# Patient Record
Sex: Male | Born: 1960 | Race: White | Hispanic: No | Smoking: Former smoker
Health system: Southern US, Community
[De-identification: ages and names within clinical notes are randomized; demographics above are authoritative.]

## PROBLEM LIST (undated history)

## (undated) DIAGNOSIS — E785 Hyperlipidemia, unspecified: Secondary | ICD-10-CM

## (undated) DIAGNOSIS — N200 Calculus of kidney: Secondary | ICD-10-CM

## (undated) DIAGNOSIS — I456 Pre-excitation syndrome: Secondary | ICD-10-CM

## (undated) DIAGNOSIS — M549 Dorsalgia, unspecified: Secondary | ICD-10-CM

## (undated) HISTORY — DX: Pre-excitation syndrome: I45.6

## (undated) HISTORY — DX: Dorsalgia, unspecified: M54.9

## (undated) HISTORY — DX: Calculus of kidney: N20.0

## (undated) HISTORY — DX: Hyperlipidemia, unspecified: E78.5

---

## 1983-05-24 HISTORY — PX: KNEE SURGERY: SHX244

## 1997-09-25 ENCOUNTER — Ambulatory Visit (HOSPITAL_BASED_OUTPATIENT_CLINIC_OR_DEPARTMENT_OTHER): Admission: RE | Admit: 1997-09-25 | Discharge: 1997-09-25 | Payer: Self-pay | Admitting: *Deleted

## 2002-11-25 ENCOUNTER — Emergency Department (HOSPITAL_COMMUNITY): Admission: EM | Admit: 2002-11-25 | Discharge: 2002-11-25 | Payer: Self-pay | Admitting: Emergency Medicine

## 2002-11-25 ENCOUNTER — Encounter: Payer: Self-pay | Admitting: Emergency Medicine

## 2004-04-09 ENCOUNTER — Ambulatory Visit: Payer: Self-pay | Admitting: Internal Medicine

## 2004-07-15 ENCOUNTER — Emergency Department (HOSPITAL_COMMUNITY): Admission: EM | Admit: 2004-07-15 | Discharge: 2004-07-15 | Payer: Self-pay | Admitting: Emergency Medicine

## 2004-12-08 ENCOUNTER — Ambulatory Visit: Payer: Self-pay | Admitting: Family Medicine

## 2005-04-20 ENCOUNTER — Emergency Department (HOSPITAL_COMMUNITY): Admission: EM | Admit: 2005-04-20 | Discharge: 2005-04-20 | Payer: Self-pay | Admitting: *Deleted

## 2005-04-23 ENCOUNTER — Emergency Department (HOSPITAL_COMMUNITY): Admission: EM | Admit: 2005-04-23 | Discharge: 2005-04-24 | Payer: Self-pay | Admitting: Emergency Medicine

## 2008-12-11 ENCOUNTER — Encounter: Admission: RE | Admit: 2008-12-11 | Discharge: 2008-12-11 | Payer: Self-pay | Admitting: Emergency Medicine

## 2009-01-01 ENCOUNTER — Emergency Department (HOSPITAL_COMMUNITY): Admission: EM | Admit: 2009-01-01 | Discharge: 2009-01-02 | Payer: Self-pay | Admitting: Emergency Medicine

## 2009-01-23 ENCOUNTER — Emergency Department (HOSPITAL_COMMUNITY)
Admission: EM | Admit: 2009-01-23 | Discharge: 2009-01-23 | Payer: Self-pay | Admitting: Blood Banking & Transfusion Medicine

## 2009-02-06 ENCOUNTER — Ambulatory Visit (HOSPITAL_COMMUNITY): Admission: RE | Admit: 2009-02-06 | Discharge: 2009-02-06 | Payer: Self-pay | Admitting: Orthopaedic Surgery
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
## 2010-06-14 ENCOUNTER — Encounter: Payer: Self-pay | Admitting: Emergency Medicine

## 2010-06-14 ENCOUNTER — Encounter: Payer: Self-pay | Admitting: Orthopaedic Surgery

## 2012-11-13 ENCOUNTER — Encounter (HOSPITAL_COMMUNITY): Payer: Self-pay | Admitting: Emergency Medicine

## 2012-11-13 DIAGNOSIS — N200 Calculus of kidney: Secondary | ICD-10-CM | POA: Insufficient documentation

## 2012-11-13 DIAGNOSIS — R39198 Other difficulties with micturition: Secondary | ICD-10-CM | POA: Insufficient documentation

## 2012-11-13 DIAGNOSIS — R112 Nausea with vomiting, unspecified: Secondary | ICD-10-CM | POA: Insufficient documentation

## 2012-11-13 NOTE — ED Notes (Signed)
PT. REPORTS RIGHT LOWER ABDOMINAL PAIN RADIATING TO RIGHT LOWER BACK WITH EMESIS ONSET Thursday  LAST WEEK , DENIES DIARRHEA  , NO FEVER , SLIGHT CHILLS.

## 2012-11-14 ENCOUNTER — Emergency Department (HOSPITAL_COMMUNITY): Payer: Self-pay

## 2012-11-14 ENCOUNTER — Encounter (HOSPITAL_COMMUNITY): Payer: Self-pay | Admitting: Radiology

## 2012-11-14 ENCOUNTER — Emergency Department (HOSPITAL_COMMUNITY)
Admission: EM | Admit: 2012-11-14 | Discharge: 2012-11-14 | Disposition: A | Discharge status: Home / Self Care | Payer: Self-pay | Attending: Emergency Medicine | Admitting: Emergency Medicine

## 2012-11-14 DIAGNOSIS — N200 Calculus of kidney: Secondary | ICD-10-CM

## 2012-11-14 LAB — CBC WITH DIFFERENTIAL/PLATELET
Basophils Absolute: 0.1 10*3/uL (ref 0.0–0.1)
Basophils Relative: 1 % (ref 0–1)
Eosinophils Absolute: 0.2 10*3/uL (ref 0.0–0.7)
Eosinophils Relative: 2 % (ref 0–5)
HCT: 39.5 % (ref 39.0–52.0)
Hemoglobin: 13.9 g/dL (ref 13.0–17.0)
Lymphocytes Relative: 14 % (ref 12–46)
Lymphs Abs: 1.4 10*3/uL (ref 0.7–4.0)
MCH: 29.3 pg (ref 26.0–34.0)
MCHC: 35.2 g/dL (ref 30.0–36.0)
MCV: 83.2 fL (ref 78.0–100.0)
Monocytes Absolute: 0.9 10*3/uL (ref 0.1–1.0)
Monocytes Relative: 8 % (ref 3–12)
Neutro Abs: 8 10*3/uL — ABNORMAL HIGH (ref 1.7–7.7)
Neutrophils Relative %: 76 % (ref 43–77)
Platelets: 216 10*3/uL (ref 150–400)
RBC: 4.75 MIL/uL (ref 4.22–5.81)
RDW: 12.7 % (ref 11.5–15.5)
WBC: 10.5 10*3/uL (ref 4.0–10.5)

## 2012-11-14 LAB — URINALYSIS, ROUTINE W REFLEX MICROSCOPIC
Bilirubin Urine: NEGATIVE
Glucose, UA: NEGATIVE mg/dL
Ketones, ur: NEGATIVE mg/dL
Leukocytes, UA: NEGATIVE
Nitrite: NEGATIVE
Protein, ur: NEGATIVE mg/dL
Specific Gravity, Urine: 1.033 — ABNORMAL HIGH (ref 1.005–1.030)
Urobilinogen, UA: 0.2 mg/dL (ref 0.0–1.0)
pH: 5.5 (ref 5.0–8.0)

## 2012-11-14 LAB — URINE MICROSCOPIC-ADD ON

## 2012-11-14 LAB — COMPREHENSIVE METABOLIC PANEL
ALT: 17 U/L (ref 0–53)
AST: 20 U/L (ref 0–37)
Albumin: 4 g/dL (ref 3.5–5.2)
Alkaline Phosphatase: 76 U/L (ref 39–117)
BUN: 25 mg/dL — ABNORMAL HIGH (ref 6–23)
CO2: 28 mEq/L (ref 19–32)
Calcium: 9.1 mg/dL (ref 8.4–10.5)
Chloride: 105 mEq/L (ref 96–112)
Creatinine, Ser: 1.09 mg/dL (ref 0.50–1.35)
GFR calc Af Amer: 88 mL/min — ABNORMAL LOW (ref >90)
GFR calc non Af Amer: 76 mL/min — ABNORMAL LOW (ref >90)
Glucose, Bld: 115 mg/dL — ABNORMAL HIGH (ref 70–99)
Potassium: 3.9 mEq/L (ref 3.5–5.1)
Sodium: 140 mEq/L (ref 135–145)
Total Bilirubin: 0.4 mg/dL (ref 0.3–1.2)
Total Protein: 6.8 g/dL (ref 6.0–8.3)

## 2012-11-14 LAB — LIPASE, BLOOD: Lipase: 29 U/L (ref 11–59)

## 2012-11-14 MED ORDER — ONDANSETRON HCL 4 MG/2ML IJ SOLN
4.0000 mg | Freq: Once | INTRAMUSCULAR | Status: AC
  Administered 2012-11-14: 4 mg via INTRAVENOUS
  Filled 2012-11-14: qty 2

## 2012-11-14 MED ORDER — OXYCODONE-ACETAMINOPHEN 5-325 MG PO TABS: 1.0000 | ORAL_TABLET | ORAL | Status: DC | PRN

## 2012-11-14 MED ORDER — TAMSULOSIN HCL 0.4 MG PO CAPS: 0.4000 mg | ORAL_CAPSULE | Freq: Every day | ORAL | Status: DC

## 2012-11-14 MED ORDER — PROMETHAZINE HCL 25 MG PO TABS: 25.0000 mg | ORAL_TABLET | Freq: Four times a day (QID) | ORAL | Status: DC | PRN

## 2012-11-14 MED ORDER — KETOROLAC TROMETHAMINE 30 MG/ML IJ SOLN
30.0000 mg | Freq: Once | INTRAMUSCULAR | Status: AC
  Administered 2012-11-14: 30 mg via INTRAVENOUS
  Filled 2012-11-14: qty 1

## 2012-11-14 MED ORDER — SODIUM CHLORIDE 0.9 % IV BOLUS (SEPSIS)
1000.0000 mL | Freq: Once | INTRAVENOUS | Status: AC
  Administered 2012-11-14: 1000 mL via INTRAVENOUS

## 2012-11-14 NOTE — ED Provider Notes (Signed)
History    CSN: [id number] Arrival date & time 11/13/12  2323  First MD Initiated Contact with Patient 11/14/12 0202     Chief Complaint  Patient presents with  . Abdominal Pain   (Consider location/radiation/quality/duration/timing/severity/associated sxs/prior Treatment) HPI  History provided by pt.   Pt has had gradually worsening, waxing and waning pain in R flank and right side of abd for the past 3 days. Sx improve w/ pepto bismol.  Associated w/ N/V, weakened urine stream and darkly colored urine.  Denies fever and change in bowels.  Denies trauma.   Does a lot of heavy lifting at work but his pain is not aggravated by lifting now.  Remote h/o L ureteral stone and current pain feels similar, though he not certain it is the same.     History reviewed. No pertinent past medical history. Past Surgical History  Procedure Laterality Date  . Knee surgery     No family history on file. History  Substance Use Topics  . Smoking status: Never Smoker   . Smokeless tobacco: Not on file  . Alcohol Use: Yes    Review of Systems  All other systems reviewed and are negative.    Allergies  Review of patient's allergies indicates no known allergies.  Home Medications   Current Outpatient Rx  Name  Route  Sig  Dispense  Refill  . bismuth subsalicylate (PEPTO BISMOL) 262 MG/15ML suspension   Oral   Take 30 mLs by mouth every 6 (six) hours as needed for indigestion.         Marland Kitchen ibuprofen (ADVIL,MOTRIN) 200 MG tablet   Oral   Take 400 mg by mouth every 6 (six) hours as needed for pain.          BP 157/86  Temp(Src) 99.4 F (37.4 C) (Oral)  Resp 18  SpO2 97% Physical Exam  Nursing note and vitals reviewed. Constitutional: He is oriented to person, place, and time. He appears well-developed and well-nourished.  Pt does not appear uncomfortable  HENT:  Head: Normocephalic and atraumatic.  Eyes:  Normal appearance  Neck: Normal range of motion.  Cardiovascular: Normal  rate and regular rhythm.   Pulmonary/Chest: Effort normal and breath sounds normal. No respiratory distress.  Abdominal: Soft. Bowel sounds are normal. He exhibits no distension and no mass. There is no tenderness. There is no rebound and no guarding.  Mild epigastric, RUQ and periumbilical ttp  Genitourinary:  R CVA tenderness  Musculoskeletal: Normal range of motion.  No tenderness of lumbar spine  Neurological: He is alert and oriented to person, place, and time.  Skin: Skin is warm and dry. No rash noted.  Psychiatric: He has a normal mood and affect. His behavior is normal.    ED Course  Procedures (including critical care time) Labs Reviewed  CBC WITH DIFFERENTIAL - Abnormal; Notable for the following:    Neutro Abs 8.0 (*)    All other components within normal limits  COMPREHENSIVE METABOLIC PANEL - Abnormal; Notable for the following:    Glucose, Bld 115 (*)    BUN 25 (*)    GFR calc non Af Amer 76 (*)    GFR calc Af Amer 88 (*)    All other components within normal limits  URINALYSIS, ROUTINE W REFLEX MICROSCOPIC - Abnormal; Notable for the following:    Specific Gravity, Urine 1.033 (*)    Hgb urine dipstick LARGE (*)    All other components within normal limits  LIPASE,  BLOOD  URINE MICROSCOPIC-ADD ON   Ct Abdomen Pelvis Wo Contrast  11/14/2012   *RADIOLOGY REPORT*  Clinical Data: Right lower abdominal pain.  Vomiting.  CT ABDOMEN AND PELVIS WITHOUT CONTRAST  Technique:  Multidetector CT imaging of the abdomen and pelvis was performed following the standard protocol without intravenous contrast.  Comparison: 05/14/2007  Findings: Lung bases are clear.  No effusions.  Heart is normal size.  Liver, spleen, gallbladder, pancreas, adrenals and left kidney are unremarkable on this unenhanced study.  Mild right hydronephrosis due to a 3 mm distal right ureteral stone.  Urinary bladder is unremarkable as is the prostate.  Appendix is visualized and is normal.  Scattered  descending colonic and sigmoid diverticulosis. No active diverticulitis.  Small bowel is decompressed.  Aorta is normal caliber.  No acute bony abnormality.  IMPRESSION: 3 mm distal right ureteral stone with mild right hydronephrosis.  Descending colonic and sigmoid diverticulosis.   Original Report Authenticated By: Charlett Nose, M.D.   1. Nephrolithiasis     MDM  52yo M w/ remote h/o L ureteral stone presents w/ waxing and waning R flank pain x 3d.  No trauma but does a lot of heavy lifting at work.  On exam, afebrile, NAD, abd soft/non-distended, tenderness epigastrium, RUQ and periumbilical region and R CVA.  Labs sig for hemoglobinuria.  CT abd/pelvis ordered to confirm R-sided nephrolithiasis because there are characteristics of pain that are not consistent w/ a stone.  Pt to receive IVF and toradol.  2:42 AM   CT shows 3mm R ureteral stone.  Results discussed w/ pt.  Pt had relief of pain w/ toradol.  He is tolerating pos.  D/c'd home w/ percocet, promethazine and flomax as well as referral to urology.  Return precautions discussed.  3:59 AM   Otilio Miu, PA-C 11/14/12 [phone]

## 2012-11-14 NOTE — ED Notes (Signed)
Patient presents c/o right flank pain that does radiate around to the front but most of the pain is in his back.  Stated the pain started last Thursday and got worse over the past few days.  Stated he has been noticing that when he urinates the stream is not as strong.

## 2012-11-15 NOTE — ED Provider Notes (Signed)
Medical screening examination/treatment/procedure(s) were performed by non-physician practitioner and as supervising physician I was immediately available for consultation/collaboration.   Toua Stites, MD 11/15/12 0929 

## 2014-01-07 ENCOUNTER — Encounter (HOSPITAL_COMMUNITY): Payer: Self-pay | Admitting: Emergency Medicine

## 2014-01-07 DIAGNOSIS — R1011 Right upper quadrant pain: Secondary | ICD-10-CM | POA: Insufficient documentation

## 2014-01-07 DIAGNOSIS — R109 Unspecified abdominal pain: Secondary | ICD-10-CM | POA: Insufficient documentation

## 2014-01-07 LAB — CBC WITH DIFFERENTIAL/PLATELET
Basophils Absolute: 0.1 10*3/uL (ref 0.0–0.1)
Basophils Relative: 1 % (ref 0–1)
Eosinophils Absolute: 0.3 10*3/uL (ref 0.0–0.7)
Eosinophils Relative: 3 % (ref 0–5)
HCT: 46.2 % (ref 39.0–52.0)
Hemoglobin: 16 g/dL (ref 13.0–17.0)
Lymphocytes Relative: 27 % (ref 12–46)
Lymphs Abs: 2.4 10*3/uL (ref 0.7–4.0)
MCH: 29.7 pg (ref 26.0–34.0)
MCHC: 34.6 g/dL (ref 30.0–36.0)
MCV: 85.9 fL (ref 78.0–100.0)
Monocytes Absolute: 0.6 10*3/uL (ref 0.1–1.0)
Monocytes Relative: 7 % (ref 3–12)
Neutro Abs: 5.5 10*3/uL (ref 1.7–7.7)
Neutrophils Relative %: 62 % (ref 43–77)
Platelets: 239 10*3/uL (ref 150–400)
RBC: 5.38 MIL/uL (ref 4.22–5.81)
RDW: 12.9 % (ref 11.5–15.5)
WBC: 8.8 10*3/uL (ref 4.0–10.5)

## 2014-01-07 LAB — URINALYSIS, ROUTINE W REFLEX MICROSCOPIC
Bilirubin Urine: NEGATIVE
Glucose, UA: NEGATIVE mg/dL
Hgb urine dipstick: NEGATIVE
Ketones, ur: 15 mg/dL — AB
Leukocytes, UA: NEGATIVE
Nitrite: NEGATIVE
Protein, ur: NEGATIVE mg/dL
Specific Gravity, Urine: 1.029 (ref 1.005–1.030)
Urobilinogen, UA: 0.2 mg/dL (ref 0.0–1.0)
pH: 5 (ref 5.0–8.0)

## 2014-01-07 LAB — COMPREHENSIVE METABOLIC PANEL
ALT: 28 U/L (ref 0–53)
AST: 28 U/L (ref 0–37)
Albumin: 4.1 g/dL (ref 3.5–5.2)
Alkaline Phosphatase: 81 U/L (ref 39–117)
Anion gap: 10 (ref 5–15)
BUN: 17 mg/dL (ref 6–23)
CO2: 29 mEq/L (ref 19–32)
Calcium: 9.8 mg/dL (ref 8.4–10.5)
Chloride: 100 mEq/L (ref 96–112)
Creatinine, Ser: 0.85 mg/dL (ref 0.50–1.35)
GFR calc Af Amer: >90 mL/min (ref >90)
GFR calc non Af Amer: >90 mL/min (ref >90)
Glucose, Bld: 81 mg/dL (ref 70–99)
Potassium: 4.8 mEq/L (ref 3.7–5.3)
Sodium: 139 mEq/L (ref 137–147)
Total Bilirubin: 0.3 mg/dL (ref 0.3–1.2)
Total Protein: 7.2 g/dL (ref 6.0–8.3)

## 2014-01-07 LAB — LIPASE, BLOOD: Lipase: 42 U/L (ref 11–59)

## 2014-01-07 NOTE — ED Notes (Signed)
The patient said he has been having abdominal pain for two months and it has gotten worse instead of better.  He says he feels like he is constipated but is  Having regular bowel movements.  He also said he feels like there is a "growth" in his stomach that is getting bigger and his abdomen is getting swollen.  He denies N/V, diarrhea or any other symptoms other than pain.

## 2014-01-07 NOTE — ED Notes (Signed)
Charting performed by this RN charted in error: DISREGARD.

## 2014-01-08 ENCOUNTER — Emergency Department (HOSPITAL_COMMUNITY)
Admission: EM | Admit: 2014-01-08 | Discharge: 2014-01-08 | Disposition: A | Discharge status: Home / Self Care | Payer: Managed Care, Other (non HMO) | Attending: Emergency Medicine | Admitting: Emergency Medicine

## 2014-01-08 ENCOUNTER — Encounter (HOSPITAL_COMMUNITY): Payer: Self-pay

## 2014-01-08 ENCOUNTER — Emergency Department (HOSPITAL_COMMUNITY): Payer: Managed Care, Other (non HMO)

## 2014-01-08 DIAGNOSIS — R1011 Right upper quadrant pain: Secondary | ICD-10-CM

## 2014-01-08 LAB — I-STAT CG4 LACTIC ACID, ED: Lactic Acid, Venous: 1.11 mmol/L (ref 0.5–2.2)

## 2014-01-08 MED ORDER — KETOROLAC TROMETHAMINE 30 MG/ML IJ SOLN
30.0000 mg | Freq: Once | INTRAMUSCULAR | Status: AC
  Administered 2014-01-08: 30 mg via INTRAVENOUS
  Filled 2014-01-08: qty 1

## 2014-01-08 MED ORDER — IOHEXOL 300 MG/ML  SOLN
25.0000 mL | Freq: Once | INTRAMUSCULAR | Status: AC | PRN
  Administered 2014-01-08: 25 mL via ORAL

## 2014-01-08 MED ORDER — IOHEXOL 300 MG/ML  SOLN
100.0000 mL | Freq: Once | INTRAMUSCULAR | Status: AC | PRN
  Administered 2014-01-08: 100 mL via INTRAVENOUS

## 2014-01-08 MED ORDER — HYDROCODONE-ACETAMINOPHEN 5-325 MG PO TABS: 1.0000 | ORAL_TABLET | Freq: Four times a day (QID) | ORAL | Status: DC | PRN

## 2014-01-08 MED ORDER — ONDANSETRON HCL 4 MG/2ML IJ SOLN
4.0000 mg | INTRAMUSCULAR | Status: AC
  Administered 2014-01-08: 4 mg via INTRAVENOUS
  Filled 2014-01-08: qty 2

## 2014-01-08 NOTE — ED Provider Notes (Signed)
CSN: [id number]     Arrival date & time 01/07/14  2042 History   First MD Initiated Contact with Patient 01/08/14 0030     Chief Complaint  Patient presents with  . Abdominal Pain    The patient has been having abdominal pain for the last two months.  He says he thought the pain would go away but it has gotten worse.    (Consider location/radiation/quality/duration/timing/severity/associated sxs/prior Treatment) HPI Comments: Patient is a 53 year old male who presents to the emergency department for further evaluation of abdominal pain. Patient states that her abdominal pain began as an aching discomfort in his right upper abdomen 2 months ago. He states that it has been constant and gradually worsening since this time. He states he has developed an intermittent sharp pain sensation recently. Patient also feels as though his abdominal pain has been spreading to include more of his abdomen. Patient has been taking Tylenol for symptoms without significant improvement. He endorses some associated subjective swelling to his right abdomen. He denies fever, chest pain, shortness of breath, nausea, vomiting, diarrhea, melena or hematochezia, urinary symptoms, numbness/tingling, and extremity weakness. Patient denies history of abdominal surgeries.  The history is provided by the patient. No language interpreter was used.    History reviewed. No pertinent past medical history. Past Surgical History  Procedure Laterality Date  . Knee surgery     History reviewed. No pertinent family history. History  Substance Use Topics  . Smoking status: Never Smoker   . Smokeless tobacco: Not on file  . Alcohol Use: Yes    Review of Systems  Gastrointestinal: Positive for abdominal pain.  All other systems reviewed and are negative.   Allergies  Review of patient's allergies indicates no known allergies.  Home Medications   Prior to Admission medications   Medication Sig Start Date End Date Taking?  Authorizing Provider  ibuprofen (ADVIL,MOTRIN) 200 MG tablet Take 400 mg by mouth every 6 (six) hours as needed for pain.   Yes Historical Provider, MD  HYDROcodone-acetaminophen (NORCO/VICODIN) 5-325 MG per tablet Take 1-2 tablets by mouth every 6 (six) hours as needed for moderate pain or severe pain. 01/08/14   Antony MaduraKelly Lula Kolton, PA-C   BP 99/69  Pulse 58  Temp(Src) 97.9 F (36.6 C) (Oral)  Resp 16  Wt 167 lb (75.751 kg)  SpO2 95%  Physical Exam  Nursing note and vitals reviewed. Constitutional: He is oriented to person, place, and time. He appears well-developed and well-nourished. No distress.  Nontoxic/nonseptic appearing  HENT:  Head: Normocephalic and atraumatic.  Eyes: Conjunctivae and EOM are normal. No scleral icterus.  Neck: Normal range of motion.  Cardiovascular: Normal rate, regular rhythm and normal heart sounds.   Pulmonary/Chest: Effort normal and breath sounds normal. No respiratory distress. He has no wheezes. He has no rales.  Chest expansion symmetric  Abdominal: Soft. He exhibits no distension. There is tenderness. There is no rebound and no guarding.  Tenderness to palpation diffusely to abdomen. No focal tenderness. No Murphy's sign. No focal tenderness at McBurney's point. No peritoneal signs or involuntary guarding. No rebound tenderness.  Musculoskeletal: Normal range of motion.  Neurological: He is alert and oriented to person, place, and time. He exhibits normal muscle tone. Coordination normal.  Skin: Skin is warm and dry. No rash noted. He is not diaphoretic. No erythema. No pallor.  Psychiatric: He has a normal mood and affect. His behavior is normal.    ED Course  Procedures (including critical care time) Labs  Review Labs Reviewed  URINALYSIS, ROUTINE W REFLEX MICROSCOPIC - Abnormal; Notable for the following:    Ketones, ur 15 (*)    All other components within normal limits  CBC WITH DIFFERENTIAL  COMPREHENSIVE METABOLIC PANEL  LIPASE, BLOOD   I-STAT CG4 LACTIC ACID, ED    Imaging Review Ct Abdomen Pelvis W Contrast  01/08/2014   CLINICAL DATA:  Right-sided abdominal pain.  EXAM: CT ABDOMEN AND PELVIS WITH CONTRAST  TECHNIQUE: Multidetector CT imaging of the abdomen and pelvis was performed using the standard protocol following bolus administration of intravenous contrast.  CONTRAST:  OMNIPAQUE IOHEXOL 300 MG/ML  SOLN  COMPARISON:  11/14/2012  FINDINGS: The lung bases are clear.  The solid abdominal organs are normal. The gallbladder is contracted. No common bile duct dilatation.  The stomach, duodenum, small bowel and colon are unremarkable. Small duodenum diverticulum I are noted. No inflammatory changes, mass lesions or obstructive findings. The appendix is normal. Moderate diverticulosis of the sigmoid colon without findings for acute diverticulitis. No mesenteric or retroperitoneal mass or adenopathy. The aorta and branch vessels are patent. The major venous structures are patent.  The bladder, prostate gland and seminal vesicles are unremarkable. No pelvic mass, adenopathy or free pelvic fluid collections. No inguinal mass or adenopathy.  The bony structures are unremarkable. There is a stable small sclerotic lesion in the right iliac bone.  IMPRESSION: No acute abdominal/pelvic findings, mass lesions or adenopathy.  Lower pole right renal calculus.   Electronically Signed   By: Loralie Champagne M.D.   On: 01/08/2014 02:39     EKG Interpretation None      MDM   Final diagnoses:  Right upper quadrant pain    53 year old male presents to the emergency department for 2 months of abdominal pain in his right upper abdomen. Patient well and nontoxic appearing, hemodynamically stable, and afebrile. Physical exam today significant for diffuse, mild abdominal tenderness on deep palpation. No peritoneal signs, guarding, or masses. Patient denies associated fever, vomiting, or diarrhea.  Initial workup today without leukocytosis,  anemia, or electrolyte balance. Liver and kidney function preserved. Lipase normal and lactate within normal limits. Urinalysis suggestive of mild dehydration. Given the duration of symptoms CT abdomen and pelvis ordered for further workup. CT imaging today shows no acute abdominal/pelvic process. It does show a right renal calculus, though this would not account for patient's pain today.  Results reviewed with patient who verbalizes understanding. Have advised patient to followup with a gastroenterologist for further evaluation of his abdominal pain as he will likely require an endoscopy and/or colonoscopy; referral provided. Norco prescribed for pain control as needed and return precautions provided. Patient agreeable to plan with no unaddressed concerns.   Filed Vitals:   01/08/14 0300 01/08/14 0304 01/08/14 0330 01/08/14 0400  BP: 109/69 109/69 104/68 99/69  Pulse: 63 62 63 58  Temp:      TempSrc:      Resp:      Weight:      SpO2: 93% 93% 93% 95%     Antony Madura, PA-C 01/08/14 [phone]

## 2014-01-08 NOTE — ED Notes (Signed)
Pt returned from CT °

## 2014-01-08 NOTE — ED Notes (Signed)
Patient transported to CT 

## 2014-01-08 NOTE — Discharge Instructions (Signed)

## 2014-01-08 NOTE — ED Provider Notes (Signed)
Medical screening examination/treatment/procedure(s) were performed by non-physician practitioner and as supervising physician I was immediately available for consultation/collaboration.   EKG Interpretation None        Glynn OctaveStephen Madyson Lukach, MD 01/08/14 [phone]

## 2014-09-18 ENCOUNTER — Encounter (HOSPITAL_COMMUNITY): Payer: Self-pay | Admitting: Emergency Medicine

## 2014-09-18 ENCOUNTER — Emergency Department (HOSPITAL_COMMUNITY): Payer: Managed Care, Other (non HMO)

## 2014-09-18 ENCOUNTER — Emergency Department (HOSPITAL_COMMUNITY)
Admission: EM | Admit: 2014-09-18 | Discharge: 2014-09-18 | Disposition: A | Discharge status: Home / Self Care | Payer: Managed Care, Other (non HMO) | Attending: Emergency Medicine | Admitting: Emergency Medicine

## 2014-09-18 DIAGNOSIS — R109 Unspecified abdominal pain: Secondary | ICD-10-CM | POA: Diagnosis present

## 2014-09-18 DIAGNOSIS — G8929 Other chronic pain: Secondary | ICD-10-CM

## 2014-09-18 DIAGNOSIS — N200 Calculus of kidney: Secondary | ICD-10-CM | POA: Insufficient documentation

## 2014-09-18 DIAGNOSIS — K580 Irritable bowel syndrome with diarrhea: Secondary | ICD-10-CM | POA: Diagnosis not present

## 2014-09-18 LAB — COMPREHENSIVE METABOLIC PANEL
ALT: 19 U/L (ref 0–53)
AST: 22 U/L (ref 0–37)
Albumin: 4.1 g/dL (ref 3.5–5.2)
Alkaline Phosphatase: 63 U/L (ref 39–117)
Anion gap: 10 (ref 5–15)
BUN: 18 mg/dL (ref 6–23)
CO2: 23 mmol/L (ref 19–32)
Calcium: 9.4 mg/dL (ref 8.4–10.5)
Chloride: 105 mmol/L (ref 96–112)
Creatinine, Ser: 0.87 mg/dL (ref 0.50–1.35)
GFR calc Af Amer: >90 mL/min (ref >90)
GFR calc non Af Amer: >90 mL/min (ref >90)
Glucose, Bld: 107 mg/dL — ABNORMAL HIGH (ref 70–99)
Potassium: 4.3 mmol/L (ref 3.5–5.1)
Sodium: 138 mmol/L (ref 135–145)
Total Bilirubin: 0.8 mg/dL (ref 0.3–1.2)
Total Protein: 7 g/dL (ref 6.0–8.3)

## 2014-09-18 LAB — URINALYSIS, ROUTINE W REFLEX MICROSCOPIC
Glucose, UA: NEGATIVE mg/dL
Ketones, ur: 15 mg/dL — AB
Leukocytes, UA: NEGATIVE
Nitrite: NEGATIVE
Protein, ur: 30 mg/dL — AB
Specific Gravity, Urine: 1.025 (ref 1.005–1.030)
Urobilinogen, UA: 0.2 mg/dL (ref 0.0–1.0)
pH: 5.5 (ref 5.0–8.0)

## 2014-09-18 LAB — URINE MICROSCOPIC-ADD ON

## 2014-09-18 LAB — CBC WITH DIFFERENTIAL/PLATELET
Basophils Absolute: 0.1 10*3/uL (ref 0.0–0.1)
Basophils Relative: 0 % (ref 0–1)
Eosinophils Absolute: 0.1 10*3/uL (ref 0.0–0.7)
Eosinophils Relative: 1 % (ref 0–5)
HCT: 49.7 % (ref 39.0–52.0)
Hemoglobin: 16.6 g/dL (ref 13.0–17.0)
Lymphocytes Relative: 12 % (ref 12–46)
Lymphs Abs: 1.4 10*3/uL (ref 0.7–4.0)
MCH: 29.1 pg (ref 26.0–34.0)
MCHC: 33.4 g/dL (ref 30.0–36.0)
MCV: 87 fL (ref 78.0–100.0)
Monocytes Absolute: 0.7 10*3/uL (ref 0.1–1.0)
Monocytes Relative: 6 % (ref 3–12)
Neutro Abs: 9.3 10*3/uL — ABNORMAL HIGH (ref 1.7–7.7)
Neutrophils Relative %: 81 % — ABNORMAL HIGH (ref 43–77)
Platelets: 264 10*3/uL (ref 150–400)
RBC: 5.71 MIL/uL (ref 4.22–5.81)
RDW: 12.9 % (ref 11.5–15.5)
WBC: 11.5 10*3/uL — ABNORMAL HIGH (ref 4.0–10.5)

## 2014-09-18 LAB — LIPASE, BLOOD: Lipase: 27 U/L (ref 11–59)

## 2014-09-18 MED ORDER — OXYCODONE-ACETAMINOPHEN 5-325 MG PO TABS: 2.0000 | ORAL_TABLET | ORAL | Status: DC | PRN

## 2014-09-18 MED ORDER — MORPHINE SULFATE 4 MG/ML IJ SOLN
4.0000 mg | Freq: Once | INTRAMUSCULAR | Status: AC
  Administered 2014-09-18: 4 mg via INTRAVENOUS
  Filled 2014-09-18: qty 1

## 2014-09-18 MED ORDER — ONDANSETRON 8 MG PO TBDP: 8.0000 mg | ORAL_TABLET | Freq: Three times a day (TID) | ORAL | Status: DC | PRN

## 2014-09-18 MED ORDER — DICYCLOMINE HCL 10 MG PO CAPS
20.0000 mg | ORAL_CAPSULE | Freq: Once | ORAL | Status: AC
  Administered 2014-09-18: 20 mg via ORAL
  Filled 2014-09-18: qty 2

## 2014-09-18 MED ORDER — SODIUM CHLORIDE 0.9 % IV BOLUS (SEPSIS)
500.0000 mL | Freq: Once | INTRAVENOUS | Status: AC
Start: 2014-09-18 — End: 2014-09-18
  Administered 2014-09-18: 500 mL via INTRAVENOUS
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
## 2014-09-18 NOTE — ED Notes (Addendum)
Pt reports that he has had chronic right sided pain on flank radiating to groin and testicle and leg. Wonders if nerve issues or liver issue bc only on right side. He has had GI workup before. States it does sometimes get better. Has been told he had a kidney stone. Pt states pain went away "after a marathron 12 hour love making session" for a few days. Pt seems anxious to find cause of this pain. Took 5 mg pain pill; did not help at all.

## 2014-09-18 NOTE — Discharge Instructions (Signed)
Your workup today shows signs of a kidney stone on the right.  Take medications as prescribed.  Follow-up with urology as directed.  Strain all urine to see if you can catch the stone.  As you are also having ongoing right-sided abdominal pain for some time, it would be good for you to establish care with a primary care doctor for further workup.  Return to the emergency department for worsening condition or new concerning symptoms.   Emergency Department Resource Guide 1) Find a Doctor and Pay Out of Pocket Although you won't have to find out who is covered by your insurance plan, it is a good idea to ask around and get recommendations. You will then need to call the office and see if the doctor you have chosen will accept you as a new patient and what types of options they offer for patients who are self-pay. Some doctors offer discounts or will set up payment plans for their patients who do not have insurance, but you will need to ask so you aren't surprised when you get to your appointment.  2) Contact Your Local Health Department Not all health departments have doctors that can see patients for sick visits, but many do, so it is worth a call to see if yours does. If you don't know where your local health department is, you can check in your phone book. The CDC also has a tool to help you locate your state's health department, and many state websites also have listings of all of their local health departments.  3) Find a Walk-in Clinic If your illness is not likely to be very severe or complicated, you may want to try a walk in clinic. These are popping up all over the country in pharmacies, drugstores, and shopping centers. They're usually staffed by nurse practitioners or physician assistants that have been trained to treat common illnesses and complaints. They're usually fairly quick and inexpensive. However, if you have serious medical issues or chronic medical problems, these are probably not  your best option.  No Primary Care Doctor: - Call Health Connect at  [phone] - they can help you locate a primary care doctor that  accepts your insurance, provides certain services, etc. - Physician Referral Service- [phone]  Chronic Pain Problems: Organization         Address  Phone   Notes  [facility]  [phone] Patients need to be referred by their primary care doctor.   Medication Assistance: Organization         Address  Phone   Notes  [facility] Medication [facility] [address] [place] [place], [place] [address] [phone] --Must be a resident of [facility] -- Must have NO insurance coverage whatsoever (no Medicaid/ Medicare, etc.) -- The pt. MUST have a primary care doctor that directs their care regularly and follows them in the community   [facility]ist  [phone]   [employer]  [phone]    Agencies that provide inexpensive medical care: Organization         Address  Phone   Notes  [facility]  [phone]   [facility]    [phone]   [facility] [address] [place], [place] [address] [phone]   [facility] [address] [place], [place] [phone]   Planned Parenthood    [phone]   [facility]    [phone]   [address], [place] Phone:  [phone], Fax:  [phone] Hours of Operation:  9 am - 6 pm, M-F.  Also accepts Medicaid/Medicare and self-pay.  [facility] for Kaka [address]atham, [address], [place] Phone: [phone], Fax: [phone]. Hours of Operation:  8:30 am - 5:30 pm, M-F.  Also accepts Medicaid and self-pay.  [facility] [place] [address], [place] Phone: [phone]   [place], [place], [place] [phone], Ext. [phone] Mondays & Thursdays: 7-9 AM.  First  15 patients are seen on a first come, first serve basis.    [place] Providers:  Organization         Address  Phone   Notes  [facility] [address], [place] [phone] Also accepts self-pay patients.  [facility] [id number] [place], [place]  [phone]   [place], [address], [place] [phone]   [facility] [address], [place] [phone]   Lucianne Lei [address], [place]   [phone] Only accepts [place] Access [place] patients after they have their name applied to their card.   Self-Pay (no insurance) in [facility]):  Organization         Address  Phone   Notes  Sickle Cell Patients, [facility] [place] [phone]   [facility] [place] [phone]   [facility] [place]  [place], [place], [place] [phone]   [facility]/Dr. Osei-Bonsu  [address], [place] or [place]  Dr, [address], [place] [phone] Phone number for both [place] and [place] locations is the same.  [facility] [address] [place] [phone]   [facility] [address] [place] or [address] [phone]   [facility] [address] [place] [phone], phone; [phone], fax Sees patients 1st and 3rd Saturday of every month.  Must not qualify for public or private insurance (i.e. Medicaid, Medicare, [place] Health Choice, Veterans' Benefits)  Household income should be no more than 200% of the poverty level The clinic cannot treat you if you are pregnant or think you are pregnant  Sexually transmitted diseases are not treated at the clinic.    Dental  Care: Organization         Address  Phone  Notes  [facility] [place] [phone] Accepts children up to age 50 who are enrolled in [place] or [place]; pregnant women with a Medicaid card; and children who have applied for Medicaid or [place] Health Choice, but were declined, whose parents can pay a reduced fee at time of service.  [facility] of [facility]  [address], [place] [phone] Accepts children up to age 74 who are enrolled in [place] or [place]; pregnant women with a Medicaid card; and children who have applied for Medicaid or [place] Health Choice, but were declined, whose parents can pay a reduced fee at time of service.  [place]  Access PROGRAM  [place] [phone] Patients are seen by appointment only. Walk-ins are not accepted. [place] will see patients 53 years of age and older. Monday - Tuesday (8am-5pm) Most Wednesdays (8:30-5pm) $30 per visit, cash only  [facility] Access PROGRAM  [address], [facility] [phone] Patients are seen by appointment only. Walk-ins are not accepted. [place] will see patients 54 years of age and older. One Wednesday Evening (Monthly: Volunteer Based).  $30 per visit, cash only  [place]  [phone] for adults; Children under age 34, call Graduate Pediatric Dentistry at [phone]. Children aged 25-14, please call [phone] to request a pediatric application.  Dental services are provided in all areas of dental care including fillings, crowns and bridges, complete and partial dentures, implants, gum treatment, root canals, and extractions. Preventive care is also provided. Treatment is provided to both adults and children. Patients are selected via a lottery and there is often a waiting list.   [facility] [address] [place]  [phone] [email or web address]   [facility] [address] [place], [place] [phone], Ext. [phone] Second and Fourth Thursday of each month, opens at 6:30 AM; Clinic ends at 9 AM.  Patients are seen on a first-come first-served basis, and a limited number are seen during each clinic.   [facility]  [address] Hillard Danker [place], [place] [phone]   Eligibility Requirements You must have lived in [place], [place], or [place] counties for at least the last three months.   You cannot be eligible for state or federal sponsored [employer], including [employer], [place], or [employer].   You generally cannot be eligible for healthcare insurance through your employer.    How to apply: Eligibility screenings are held every Tuesday and Wednesday afternoon from 1:00 pm until 4:00 pm. You do not need an appointment for the interview!  [facility] [address], [place], [place]   [place]  [place] Department  [place]  [phone]    Behavioral Health Resources in the Community: Intensive Outpatient Programs Organization         Address  Phone  Notes  [facility] [place]. [address], [place], [place] [phone]   [facility] Outpatient [address] [place], [place]   ADS: Alcohol & Drug Svcs [address] [place], [place]   [place] [address],  [place], [place] or [phone]   Substance Abuse Resources Organization         Address  Phone  Notes  Alcohol and Drug Services  [phone]   [place]  [phone]   [facility] [place]   Chinita Pester  [phone]   Residential & Outpatient Substance Abuse Program  [phone]    Psychological Services Organization         Address  Phone  Notes  [facility] [place]  [place]  [phone]   [place] [address] [place] or [phone]    Mobile Crisis Teams Organization         Address  Phone  Notes  [facility], Mobile Crisis Care Unit  [phone]   [facility]  [address] [place],  [place] [phone]   [facility] De[facility]sch [address] [place] [phone]    Self-Help/Support Groups Organization         Address  Phone             Notes  [facility] [place] - variety of support groups  336- [phone] Call for more information  Narcotics Anonymous (NA), Caring Services [address], [place]  2 meetings at this location   Statisticianesidential Treatment Programs Organization         Address  Phone  Notes  [facility] [address],    [place]  [phone]   [facility]  [address], [place] [phone], [place], [place] [phone]   [facility] [address], [place] [phone] Admissions: 8am-3pm M-F  [facility] [address]    [place], [place] [phone]   [facility] [address], [place], [place] [phone]   [facility] [address]  [place], [place] [phone]   [facility] - Intensive Outpatient [address], [place], [place] [phone]   [facility] [address]  [place], [place] [phone] or [phone]   [facility] ([facility]) [address] [place], [place] [phone] Accepts Medicaid  [facility] [address]  [place] [phone] Substance Abuse/Addiction Treatment   [facility] Behavioral Health Resources Organization         Address  Phone  Notes  [facility]  [phone]   Angie FavaJulie Brannon, PhD [address], [address] [place], [place]   [phone] or [phone]) [phone]   [facility]    [address] [place], [place] [phone]   [facility] [address], [place], [place] [phone] Insurance/Medicaid/sponsorship through [facility]  [facility] Families [address]                                    [place], [place] [phone] Therapy/tele-psych/case  [facility] [address].   [place], [place] [phone]    Dr. Lolly MustacheArfeen  [phone]   [facility]  [employer] [facility] 1) [address],  2) [address], [place] 3)  [address], [place] [phone]  [phone](336) [phone]   [facility] Child Abuse Hotline [phone]) [phone] or [phone](336) [phone] (After Hours)       Flank Pain Flank pain refers to pain that is located on the side of the body between the upper abdomen and the back. The pain may occur over a short period of time (acute) or may be long-term or reoccurring (chronic). It may be mild or severe. Flank pain can be caused by many things. CAUSES  Some of the more common causes of flank pain include:  Muscle strains.   Muscle spasms.   A disease of your spine (vertebral disk disease).   A lung infection (pneumonia).   Fluid around your lungs (pulmonary edema).   A kidney infection.   Kidney stones.   A very painful skin rash caused by the chickenpox virus (shingles).   Gallbladder disease.  HOME CARE INSTRUCTIONS  Home care will depend on the cause of your pain. In general,  Rest as directed by your caregiver.  Drink  enough fluids to keep your urine clear or pale yellow.  Only take over-the-counter or prescription medicines as directed by your caregiver. Some medicines may help relieve the pain.  Tell your caregiver about any changes in your pain.  Follow up with your caregiver as  directed. SEEK IMMEDIATE MEDICAL CARE IF:   Your pain is not controlled with medicine.   You have new or worsening symptoms.  Your pain increases.   You have abdominal pain.   You have shortness of breath.   You have persistent nausea or vomiting.   You have swelling in your abdomen.   You feel faint or pass out.   You have blood in your urine.  You have a fever or persistent symptoms for more than 2-3 days.  You have a fever and your symptoms suddenly get worse. MAKE SURE YOU:   Understand these instructions.  Will watch your condition.  Will get help right away if you are not doing well or get worse. Document Released: 06/30/2005 Document Revised: 02/01/2012 Document Reviewed: 12/22/2011 [facility] Patient Information 2015 [place], [place]. This information is not intended to replace advice given to you by your health care provider. Make sure you discuss any questions you have with your health care provider.  Kidney Stones Kidney stones (urolithiasis) are deposits that form inside your kidneys. The intense pain is caused by the stone moving through the urinary tract. When the stone moves, the ureter goes into spasm around the stone. The stone is usually passed in the urine.  CAUSES   A disorder that makes certain neck glands produce too much parathyroid hormone (primary hyperparathyroidism).  A buildup of uric acid crystals, similar to gout in your joints.  Narrowing (stricture) of the ureter.  A kidney obstruction present at birth (congenital obstruction).  Previous surgery on the kidney or ureters.  Numerous kidney infections. SYMPTOMS   Feeling sick to your stomach (nauseous).  Throwing up (vomiting).  Blood in the urine (hematuria).  Pain that usually spreads (radiates) to the groin.  Frequency or urgency of urination. DIAGNOSIS   Taking a history and physical exam.  Blood or urine tests.  CT scan.  Occasionally, an examination of the  inside of the urinary bladder (cystoscopy) is performed. TREATMENT   Observation.  Increasing your fluid intake.  Extracorporeal shock wave lithotripsy--This is a noninvasive procedure that uses shock waves to break up kidney stones.  Surgery may be needed if you have severe pain or persistent obstruction. There are various surgical procedures. Most of the procedures are performed with the use of small instruments. Only small incisions are needed to accommodate these instruments, so recovery time is minimized. The size, location, and chemical composition are all important variables that will determine the proper choice of action for you. Talk to your health care provider to better understand your situation so that you will minimize the risk of injury to yourself and your kidney.  HOME CARE INSTRUCTIONS   Drink enough water and fluids to keep your urine clear or pale yellow. This will help you to pass the stone or stone fragments.  Strain all urine through the provided strainer. Keep all particulate matter and stones for your health care provider to see. The stone causing the pain may be as small as a grain of salt. It is very important to use the strainer each and every time you pass your urine. The collection of your stone will allow your health care provider to analyze it and verify that a  stone has actually passed. The stone analysis will often identify what you can do to reduce the incidence of recurrences.  Only take over-the-counter or prescription medicines for pain, discomfort, or fever as directed by your health care provider.  Make a follow-up appointment with your health care provider as directed.  Get follow-up X-rays if required. The absence of pain does not always mean that the stone has passed. It may have only stopped moving. If the urine remains completely obstructed, it can cause loss of kidney function or even complete destruction of the kidney. It is your responsibility to  make sure X-rays and follow-ups are completed. Ultrasounds of the kidney can show blockages and the status of the kidney. Ultrasounds are not associated with any radiation and can be performed easily in a matter of minutes. SEEK MEDICAL CARE IF:  You experience pain that is progressive and unresponsive to any pain medicine you have been prescribed. SEEK IMMEDIATE MEDICAL CARE IF:   Pain cannot be controlled with the prescribed medicine.  You have a fever or shaking chills.  The severity or intensity of pain increases over 18 hours and is not relieved by pain medicine.  You develop a new onset of abdominal pain.  You feel faint or pass out.  You are unable to urinate. MAKE SURE YOU:   Understand these instructions.  Will watch your condition.  Will get help right away if you are not doing well or get worse. Document Released: 05/09/2005 Document Revised: 01/09/2013 Document Reviewed: 10/10/2012 [facility] Patient Information 2015 [place], [place]. This information is not intended to replace advice given to you by your health care provider. Make sure you discuss any questions you have with your health care provider.

## 2016-03-31 ENCOUNTER — Encounter: Payer: Self-pay | Admitting: Primary Care

## 2016-03-31 ENCOUNTER — Ambulatory Visit (INDEPENDENT_AMBULATORY_CARE_PROVIDER_SITE_OTHER): Payer: Self-pay | Admitting: Primary Care

## 2016-03-31 VITALS — BP 126/74 | HR 80 | Temp 98.2°F | Ht 69.75 in | Wt 197.8 lb

## 2016-03-31 DIAGNOSIS — G8929 Other chronic pain: Secondary | ICD-10-CM

## 2016-03-31 DIAGNOSIS — M549 Dorsalgia, unspecified: Secondary | ICD-10-CM

## 2016-03-31 DIAGNOSIS — Z0000 Encounter for general adult medical examination without abnormal findings: Secondary | ICD-10-CM | POA: Insufficient documentation

## 2016-03-31 DIAGNOSIS — M546 Pain in thoracic spine: Secondary | ICD-10-CM | POA: Insufficient documentation

## 2016-03-31 DIAGNOSIS — Z87442 Personal history of urinary calculi: Secondary | ICD-10-CM

## 2016-03-31 DIAGNOSIS — R1031 Right lower quadrant pain: Secondary | ICD-10-CM

## 2016-03-31 DIAGNOSIS — R6882 Decreased libido: Secondary | ICD-10-CM

## 2016-03-31 DIAGNOSIS — M545 Low back pain: Secondary | ICD-10-CM

## 2016-03-31 LAB — COMPREHENSIVE METABOLIC PANEL
ALT: 26 U/L (ref 0–53)
AST: 17 U/L (ref 0–37)
Albumin: 4.5 g/dL (ref 3.5–5.2)
Alkaline Phosphatase: 69 U/L (ref 39–117)
BUN: 21 mg/dL (ref 6–23)
CO2: 29 mEq/L (ref 19–32)
Calcium: 9.6 mg/dL (ref 8.4–10.5)
Chloride: 104 mEq/L (ref 96–112)
Creatinine, Ser: 0.83 mg/dL (ref 0.40–1.50)
GFR: 101.8 mL/min (ref >60.00)
Glucose, Bld: 94 mg/dL (ref 70–99)
Potassium: 4.5 mEq/L (ref 3.5–5.1)
Sodium: 140 mEq/L (ref 135–145)
Total Bilirubin: 0.9 mg/dL (ref 0.2–1.2)
Total Protein: 6.9 g/dL (ref 6.0–8.3)

## 2016-03-31 LAB — LIPID PANEL
Cholesterol: 286 mg/dL — ABNORMAL HIGH (ref 0–200)
HDL: 51.8 mg/dL (ref >39.00)
LDL Cholesterol: 202 mg/dL — ABNORMAL HIGH (ref 0–99)
NonHDL: 234.43
Total CHOL/HDL Ratio: 6
Triglycerides: 163 mg/dL — ABNORMAL HIGH (ref 0.0–149.0)
VLDL: 32.6 mg/dL (ref 0.0–40.0)

## 2016-03-31 LAB — PSA: PSA: 2.67 ng/mL (ref 0.10–4.00)

## 2016-03-31 MED ORDER — SILDENAFIL CITRATE 20 MG PO TABS: ORAL_TABLET | ORAL | 0 refills | Status: DC

## 2016-03-31 NOTE — Assessment & Plan Note (Signed)
Ongoing for the past 6 months, improved with Viagra for which he requests refills. Prescription for sildenafil 20 mg sent to pharmacy. Discussed indications for use and potential side effects.

## 2016-03-31 NOTE — Assessment & Plan Note (Signed)
Thoracic (T7-8) with radiation of pain to right lower back. Intermittent for years. No radiculopathy. Has visited chiropractor for treatment with temporary improvement. X-rays completed in past. We'll continue to monitor and consider MRI of the future if this persists.

## 2016-03-31 NOTE — Assessment & Plan Note (Signed)
Recurrent kidney stones with intermittent symptoms of right groin pain, flank pain. No acute symptoms today. He requests urology evaluation given intermittent symptoms. Referral placed to urology for further evaluation.

## 2016-03-31 NOTE — Assessment & Plan Note (Signed)
Immunizations up-to-date. Declines influenza vaccination. Colonoscopy up-to-date, normal. Discussed the importance of a healthy diet and regular exercise in order to reduce the risk of other medical diseases. Exam unremarkable. Labs pending. Follow-up in one year for repeat physical.

## 2016-03-31 NOTE — Progress Notes (Signed)
Subjective:    Patient ID: Michael Farley, male    DOB: 1961/04/07, 55 y.o.   MRN: [id number]  HPI  Michael Farley is a 55 year old male who presents today to establish care, discuss the problems mentioned below, and for complete physical. Will obtain records.  1) Decresed Sexual Stamina: Decreased stamina over the past 6 months. He was managed on Viagra with improvement and is requesting a refill. He doesn't have difficulty obtaining an erection. He is not currently managed on other prescription medications.  2) History of Kidney Stones: History of renal stones in 2015 and also 2016. He does experience symptoms of right groin pain and flank pain intermittently for the past 1 year. He denies hematuria, fevers, dysuria at this time. It was strongly recommended at the time that he follow with the urologist for further evaluation, he is ready to do so at this point given persistent symptoms.   Immunizations: -Tetanus: Completed within 10 years. -Influenza: Declines.   Diet: He endorses a healthy diet. Breakfast: Cereal, fruit, eggs Lunch: Fast food  Dinner: Meat, vegetables, restaurants, fruit Snacks: Fruit Desserts: Occasionally Beverages: Milk, water, juice  Exercise: She does not currently exercise, active Eye exam: Completed in 2017 Dental exam: Completed 3-5 years ago Colonoscopy: Completed in 2-3 years ago, normal.    Review of Systems  Constitutional: Negative for unexpected weight change.  HENT: Negative for rhinorrhea.   Respiratory: Negative for cough and shortness of breath.   Cardiovascular: Negative for chest pain.  Gastrointestinal: Negative for constipation and diarrhea.  Genitourinary: Negative for difficulty urinating.  Musculoskeletal: Negative for arthralgias and myalgias.       Chronic back pain  Skin: Negative for rash.  Allergic/Immunologic: Negative for environmental allergies.  Neurological: Negative for dizziness, numbness and headaches.    Psychiatric/Behavioral:       Denies concerns for anxiety or depression       Past Medical History:  Diagnosis Date  . Back pain   . Kidney stones   . Wolff-Parkinson-White (WPW) syndrome      Social History   Social History  . Marital status: Married    Spouse name: N/A  . Number of children: N/A  . Years of education: N/A   Occupational History  . Not on file.   Social History Main Topics  . Smoking status: Never Smoker  . Smokeless tobacco: Not on file  . Alcohol use Yes  . Drug use: No  . Sexual activity: Not on file   Other Topics Concern  . Not on file   Social History Narrative   Single.   1 child.   Works as a Psychologist, sport and exercisebusiness owner.    Enjoys spending time with his girlfriend, spending time at the beach.     Past Surgical History:  Procedure Laterality Date  . KNEE SURGERY Left 1985    Family History  Problem Relation Age of Onset  . COPD Mother   . Hypertension Mother   . Alcohol abuse Father   . Diabetes Father   . Cancer Maternal Grandmother     No Known Allergies  No current outpatient prescriptions on file prior to visit.   No current facility-administered medications on file prior to visit.     BP 126/74   Pulse 80   Temp 98.2 F (36.8 C) (Oral)   Ht 5' 9.75" (1.772 m)   Wt 197 lb 12.8 oz (89.7 kg)   SpO2 98%   BMI 28.59 kg/m    Objective:  Physical Exam  Constitutional: He is oriented to person, place, and time. He appears well-nourished.  HENT:  Right Ear: Tympanic membrane and ear canal normal.  Left Ear: Tympanic membrane and ear canal normal.  Nose: Nose normal. Right sinus exhibits no maxillary sinus tenderness and no frontal sinus tenderness. Left sinus exhibits no maxillary sinus tenderness and no frontal sinus tenderness.  Mouth/Throat: Oropharynx is clear and moist.  Eyes: Conjunctivae and EOM are normal. Pupils are equal, round, and reactive to light.  Neck: Neck supple. Carotid bruit is not present. No thyromegaly  present.  Cardiovascular: Normal rate, regular rhythm and normal heart sounds.   Pulmonary/Chest: Effort normal and breath sounds normal. He has no wheezes. He has no rales.  Abdominal: Soft. Bowel sounds are normal. There is no tenderness.  Musculoskeletal: Normal range of motion.  Neurological: He is alert and oriented to person, place, and time. He has normal reflexes. No cranial nerve deficit.  Skin: Skin is warm and dry.  Psychiatric: He has a normal mood and affect.          Assessment & Plan:

## 2016-03-31 NOTE — Patient Instructions (Signed)
Complete lab work prior to leaving today. I will notify you of your results once received.   You will be contacted regarding your referral to Urology.  Please let us know if you have not heard back within one week.   It's importance to continue working on your diet by reducing consumption of fast food, fried food, processed snack foods, sugary drinks. Increase consumption of fresh vegetables and fruits, whole grains, water.  Ensure you are drinking 64 ounces of water daily.  Start exercising. You should be getting 150 minutes of moderate intensity exercise weekly.  I sent a prescription for Sildenafil 20 mg tablets to your pharmacy. Take 1/2 to 1 tablet by mouth 30 minutes prior to intercourse.  Follow up in 1 year for repeat physical or sooner if needed.  It was a pleasure to meet you today! Please don't hesitate to call me with any questions. Welcome to [facility]!

## 2016-03-31 NOTE — Progress Notes (Signed)
Pre visit review using our clinic review tool, if applicable. No additional management support is needed unless otherwise documented below in the visit note. 

## 2016-04-01 ENCOUNTER — Telehealth: Payer: Self-pay

## 2016-04-01 ENCOUNTER — Other Ambulatory Visit: Payer: Self-pay | Admitting: Primary Care

## 2016-04-01 DIAGNOSIS — E782 Mixed hyperlipidemia: Secondary | ICD-10-CM

## 2016-04-01 NOTE — Telephone Encounter (Signed)
Pt checking on status of sildenafil refill; pt said not at [facility] [address]. I [facility] [address] and I would be glad to have transferred since [facility] had [facility]. [facility] and it might be cheaper there. Nothing further needed.

## 2016-04-06 ENCOUNTER — Telehealth: Payer: Self-pay

## 2016-04-06 NOTE — Telephone Encounter (Signed)
Please call the Urology office and get more information. Are they requesting I order the ultrasound? What kind of ultrasound? Renal stone ultrasound? I'm happy to place the order, but I need to know exactly what they want.

## 2016-04-06 NOTE — Telephone Encounter (Signed)
Pt has a referral in place with urologist and pt called urologist office and requested an US on the same day pt is to be seen by urologist; pt was advised would do consult first; usually PCP will order US. Pt wants to know if Mayra ReelKate Clark NP will order US. Pt request cb. Pt is aware Jae DireKate is out of office this afternoon.

## 2016-04-07 NOTE — Telephone Encounter (Signed)
Spoken to patient. Patient has an appointment with Urology on 05/06/2016. Patient stated that years ago he had ultrasound of his abdominal because of right side abdominal pain. (there is an ultrasound of the abdominal on 09/18/2014). He was suggested by the hospital then that he need to see Urology.  So patient wanted to know if Jae DireKate can order an ultrasound and he can bring the results to Urology.

## 2016-04-07 NOTE — Telephone Encounter (Signed)
Spoken and notified patient of Kate's comments. Patient verbalized understanding.   Patient will wait until he see Urologist.

## 2016-04-07 NOTE — Telephone Encounter (Signed)
Noted  

## 2016-04-07 NOTE — Telephone Encounter (Signed)
I would prefer he see Urology first as they may want an entirely different imaging study than what I order. I recommend he attend the appointment first. Unless he's experiencing symptoms now. Is that the case? If so, then what are his symptoms?

## 2016-04-13 ENCOUNTER — Telehealth: Payer: Self-pay

## 2016-04-13 DIAGNOSIS — N529 Male erectile dysfunction, unspecified: Secondary | ICD-10-CM

## 2016-04-13 NOTE — Telephone Encounter (Signed)
Patient called and asking if you will prescribe him Viagra. He said that the current medication is not effective.

## 2016-04-16 MED ORDER — SILDENAFIL CITRATE 50 MG PO TABS: ORAL_TABLET | ORAL | 1 refills | Status: DC

## 2016-04-16 NOTE — Telephone Encounter (Signed)
Noted, please notify patient that I sent in a prescription for Viagram 50 mg. Take 1/2 to 1 tablet by mouth 30 minutes prior to intercourse.

## 2016-04-18 NOTE — Telephone Encounter (Signed)
Spoken and notified patient of Kate's comments. Patient verbalized understanding. 

## 2016-05-06 ENCOUNTER — Ambulatory Visit: Payer: Self-pay | Admitting: Urology

## 2016-05-06 ENCOUNTER — Encounter: Payer: Self-pay | Admitting: Urology

## 2016-05-06 VITALS — BP 135/90 | HR 86 | Ht 69.75 in | Wt 200.3 lb

## 2016-05-06 DIAGNOSIS — R109 Unspecified abdominal pain: Secondary | ICD-10-CM

## 2016-05-06 NOTE — Progress Notes (Signed)
05/06/2016 2:10 PM   Ayesha RumpfPhillip Messina 1961-03-04 [id number]  Referring provider: Doreene NestKatherine K Clark, NP [address] [place], [place] [address]  Chief Complaint  Patient presents with  . New Patient (Initial Visit)    Right Inguinal pain    HPI: The patient is a 55 year old gentleman with a past medical history of nephrolithiasis who presents today for evaluation of right groin pain.  This has been present for over one year. It begins in near his midline back and wraps around anteriorly under his rib cage. It also radiates down toward his groin. Ibuprofen makes the pain better. He also has a massage therapist which also helps with the pain. He describes it as sharp and constant. He has no intermittent pain. It does not feel like his previous kidney stones. It is worsened by physical activity. Denies hematuria. He has passed stones in the past however it has been many many years.  PSA was 2.67 in November 2017.   PMH: Past Medical History:  Diagnosis Date  . Back pain   . Kidney stones   . Wolff-Parkinson-White (WPW) syndrome     Surgical History: Past Surgical History:  Procedure Laterality Date  . KNEE SURGERY Left 1985    Home Medications:  Allergies as of 05/06/2016   No Known Allergies     Medication List       Accurate as of 05/06/16  2:10 PM. Always use your most recent med list.          sildenafil 50 MG tablet Commonly known as:  VIAGRA Take 1/2 to 1 tablet by mouth 30 minutes prior to intercourse.       Allergies: No Known Allergies  Family History: Family History  Problem Relation Age of Onset  . COPD Mother   . Hypertension Mother   . Alcohol abuse Father   . Diabetes Father   . Cancer Maternal Grandmother   . Prostate cancer Maternal Uncle   . Bladder Cancer Neg Hx   . Kidney cancer Neg Hx     Social History:  reports that he has never smoked. He has never used smokeless tobacco. He reports that he drinks alcohol. He reports that he  does not use drugs.  ROS: UROLOGY Frequent Urination?: No Hard to postpone urination?: No Burning/pain with urination?: No Get up at night to urinate?: No Leakage of urine?: No Urine stream starts and stops?: No Trouble starting stream?: No Do you have to strain to urinate?: No Blood in urine?: No Urinary tract infection?: No Sexually transmitted disease?: No Injury to kidneys or bladder?: No Painful intercourse?: No Weak stream?: No Erection problems?: No Penile pain?: No  Gastrointestinal Nausea?: No Vomiting?: No Indigestion/heartburn?: No Diarrhea?: No Constipation?: No  Constitutional Fever: No Night sweats?: No Weight loss?: No Fatigue?: No  Skin Skin rash/lesions?: No Itching?: Yes  Eyes Blurred vision?: No Double vision?: No  Ears/Nose/Throat Sore throat?: No Sinus problems?: Yes  Hematologic/Lymphatic Swollen glands?: No Easy bruising?: No  Cardiovascular Leg swelling?: No Chest pain?: Yes  Respiratory Cough?: No Shortness of breath?: No  Endocrine Excessive thirst?: No  Musculoskeletal Back pain?: No Joint pain?: No  Neurological Headaches?: No Dizziness?: No  Psychologic Depression?: No Anxiety?: Yes  Physical Exam: BP 135/90 (BP Location: Left Arm, Patient Position: Sitting, Cuff Size: Normal)   Pulse 86   Ht 5' 9.75" (1.772 m)   Wt 200 lb 4.8 oz (90.9 kg)   BMI 28.95 kg/m   Constitutional:  Alert and  oriented, No acute distress. HEENT: [place] AT, moist mucus membranes.  Trachea midline, no masses. Cardiovascular: No clubbing, cyanosis, or edema. Respiratory: Normal respiratory effort, no increased work of breathing. GI: Abdomen is soft, nontender, nondistended, no abdominal masses GU: No CVA tenderness. Normal phallus. Testicles descended equally bilaterally without masses. DRE: 30 g benign. Skin: No rashes, bruises or suspicious lesions. Lymph: No cervical or inguinal adenopathy. Neurologic: Grossly intact, no focal  deficits, moving all 4 extremities. Psychiatric: Normal mood and affect.  Laboratory Data: Lab Results  Component Value Date   WBC 11.5 (H) 09/18/2014   HGB 16.6 09/18/2014   HCT 49.7 09/18/2014   MCV 87.0 09/18/2014   PLT 264 09/18/2014    Lab Results  Component Value Date   CREATININE 0.83 03/31/2016    Lab Results  Component Value Date   PSA 2.67 03/31/2016    No results found for: TESTOSTERONE  No results found for: HGBA1C  Urinalysis    Component Value Date/Time   COLORURINE AMBER (A) 09/18/2014 1112   APPEARANCEUR HAZY (A) 09/18/2014 1112   LABSPEC 1.025 09/18/2014 1112   PHURINE 5.5 09/18/2014 1112   GLUCOSEU NEGATIVE 09/18/2014 1112   HGBUR LARGE (A) 09/18/2014 1112   BILIRUBINUR SMALL (A) 09/18/2014 1112   KETONESUR 15 (A) 09/18/2014 1112   PROTEINUR 30 (A) 09/18/2014 1112   UROBILINOGEN 0.2 09/18/2014 1112   NITRITE NEGATIVE 09/18/2014 1112   LEUKOCYTESUR NEGATIVE 09/18/2014 1112    Assessment & Plan:   1. Right flank pain I discussed with the patient that his pain is not consistent with that of an obstructive ureteral calculus. I believe it to be musculoskeletal in nature. I offered him a consultation with physical therapy as this likely would help his symptoms. He is currently not interested.   2. Prostate cancer screening Currently up-to-date. He will follow-up with his primary care provider for annual rectal exam and PSA.  Return if symptoms worsen or fail to improve.  Hildred LaserBrian James Hensley Treat, MD  [facility] [address], [address] [place], [place] [phone]

## 2016-07-28 ENCOUNTER — Other Ambulatory Visit: Payer: Self-pay | Admitting: Primary Care

## 2016-07-28 ENCOUNTER — Ambulatory Visit (INDEPENDENT_AMBULATORY_CARE_PROVIDER_SITE_OTHER): Payer: Self-pay | Admitting: Physician Assistant

## 2016-07-28 VITALS — BP 100/70 | HR 77 | Temp 98.7°F | Resp 16 | Ht 69.5 in | Wt 199.8 lb

## 2016-07-28 DIAGNOSIS — J069 Acute upper respiratory infection, unspecified: Secondary | ICD-10-CM

## 2016-07-28 DIAGNOSIS — R6882 Decreased libido: Secondary | ICD-10-CM

## 2016-07-28 DIAGNOSIS — J320 Chronic maxillary sinusitis: Secondary | ICD-10-CM

## 2016-07-28 MED ORDER — BENZONATATE 100 MG PO CAPS: 100.0000 mg | ORAL_CAPSULE | Freq: Three times a day (TID) | ORAL | 0 refills | Status: DC | PRN

## 2016-07-28 MED ORDER — AMOXICILLIN-POT CLAVULANATE 875-125 MG PO TABS: 1.0000 | ORAL_TABLET | Freq: Two times a day (BID) | ORAL | 0 refills | Status: DC

## 2016-07-28 NOTE — Telephone Encounter (Signed)
Ok to refill? Electronically refill request for sildenafil (REVATIO) 20 MG tablet. Last prescribed on 04/16/2016. Last seen on 03/31/2016.

## 2016-07-28 NOTE — Patient Instructions (Addendum)
Take the antibiotic one tablet twice a day for the next 10 days. You can use a decongestant to help with sinus pressure but don't add more to your Alka-Seltzer, just take one or the other. Make sure your drinking plenty of water.      IF you received an x-ray today, you will receive an invoice from [facility]. Please contact [facility] at [phone] with questions or concerns regarding your invoice.   IF you received labwork today, you will receive an invoice from [facility]. Please contact [facility] at [phone] with questions or concerns regarding your invoice.   Our billing staff will not be able to assist you with questions regarding bills from these companies.  You will be contacted with the lab results as soon as they are available. The fastest way to get your results is to activate your My Chart account. Instructions are located on the last page of this paperwork. If you have not heard from [place] regarding the results in 2 weeks, please contact this office.     Sinusitis, Adult Sinusitis is soreness and inflammation of your sinuses. Sinuses are hollow spaces in the bones around your face. Your sinuses are located:  Around your eyes.  In the middle of your forehead.  Behind your nose.  In your cheekbones. Your sinuses and nasal passages are lined with a stringy fluid (mucus). Mucus normally drains out of your sinuses. When your nasal tissues become inflamed or swollen, the mucus can become trapped or blocked so air cannot flow through your sinuses. This allows bacteria, viruses, and funguses to grow, which leads to infection. Sinusitis can develop quickly and last for 7?10 days (acute) or for more than 12 weeks (chronic). Sinusitis often develops after a cold. What are the causes? This condition is caused by anything that creates swelling in the sinuses or stops mucus from draining, including:  Allergies.  Asthma.  Bacterial or viral infection.  Abnormally  shaped bones between the nasal passages.  Nasal growths that contain mucus (nasal polyps).  Narrow sinus openings.  Pollutants, such as chemicals or irritants in the air.  A foreign object stuck in the nose.  A fungal infection. This is rare. What increases the risk? The following factors may make you more likely to develop this condition:  Having allergies or asthma.  Having had a recent cold or respiratory tract infection.  Having structural deformities or blockages in your nose or sinuses.  Having a weak immune system.  Doing a lot of swimming or diving.  Overusing nasal sprays.  Smoking. What are the signs or symptoms? The main symptoms of this condition are pain and a feeling of pressure around the affected sinuses. Other symptoms include:  Upper toothache.  Earache.  Headache.  Bad breath.  Decreased sense of smell and taste.  A cough that may get worse at night.  Fatigue.  Fever.  Thick drainage from your nose. The drainage is often green and it may contain pus (purulent).  Stuffy nose or congestion.  Postnasal drip. This is when extra mucus collects in the throat or back of the nose.  Swelling and warmth over the affected sinuses.  Sore throat.  Sensitivity to light. How is this diagnosed? This condition is diagnosed based on symptoms, a medical history, and a physical exam. To find out if your condition is acute or chronic, your health care provider may:  Look in your nose for signs of nasal polyps.  Tap over the affected sinus to check for  signs of infection.  View the inside of your sinuses using an imaging device that has a light attached (endoscope). If your health care provider suspects that you have chronic sinusitis, you may also:  Be tested for allergies.  Have a sample of mucus taken from your nose (nasal culture) and checked for bacteria.  Have a mucus sample examined to see if your sinusitis is related to an allergy. If your  sinusitis does not respond to treatment and it lasts longer than 8 weeks, you may have an MRI or CT scan to check your sinuses. These scans also help to determine how severe your infection is. In rare cases, a bone biopsy may be done to rule out more serious types of fungal sinus disease. How is this treated? Treatment for sinusitis depends on the cause and whether your condition is chronic or acute. If a virus is causing your sinusitis, your symptoms will go away on their own within 10 days. You may be given medicines to relieve your symptoms, including:  Topical nasal decongestants. They shrink swollen nasal passages and let mucus drain from your sinuses.  Antihistamines. These drugs block inflammation that is triggered by allergies. This can help to ease swelling in your nose and sinuses.  Topical nasal corticosteroids. These are nasal sprays that ease inflammation and swelling in your nose and sinuses.  Nasal saline washes. These rinses can help to get rid of thick mucus in your nose. If your condition is caused by bacteria, you will be given an antibiotic medicine. If your condition is caused by a fungus, you will be given an antifungal medicine. Surgery may be needed to correct underlying conditions, such as narrow nasal passages. Surgery may also be needed to remove polyps. Follow these instructions at home: Medicines   Take, use, or apply over-the-counter and prescription medicines only as told by your health care provider. These may include nasal sprays.  If you were prescribed an antibiotic medicine, take it as told by your health care provider. Do not stop taking the antibiotic even if you start to feel better. Hydrate and Humidify   Drink enough water to keep your urine clear or pale yellow. Staying hydrated will help to thin your mucus.  Use a cool mist humidifier to keep the humidity level in your home above 50%.  Inhale steam for 10-15 minutes, 3-4 times a day or as told by  your health care provider. You can do this in the bathroom while a hot shower is running.  Limit your exposure to cool or dry air. Rest   Rest as much as possible.  Sleep with your head raised (elevated).  Make sure to get enough sleep each night. General instructions   Apply a warm, moist washcloth to your face 3-4 times a day or as told by your health care provider. This will help with discomfort.  Wash your hands often with soap and water to reduce your exposure to viruses and other germs. If soap and water are not available, use hand sanitizer.  Do not smoke. Avoid being around people who are smoking (secondhand smoke).  Keep all follow-up visits as told by your health care provider. This is important. Contact a health care provider if:  You have a fever.  Your symptoms get worse.  Your symptoms do not improve within 10 days. Get help right away if:  You have a severe headache.  You have persistent vomiting.  You have pain or swelling around your face or eyes.  You have vision problems.  You develop confusion.  Your neck is stiff.  You have trouble breathing. This information is not intended to replace advice given to you by your health care provider. Make sure you discuss any questions you have with your health care provider. Document Released: 05/09/2005 Document Revised: 01/03/2016 Document Reviewed: 03/04/2015 [employer] Interactive Patient Education  2017 [employer]

## 2016-07-28 NOTE — Progress Notes (Signed)
     Patient ID: Michael Farley, male    DOB: [birth year], 56 y.o.   MRN: [id number]  PCP: Morrie Sheldonlark,Katherine Kendal, NP  Chief Complaint  Patient presents with  . Cough    GREEN MUCUS X 1 WEEK  . SINUS PRESSURE    WITH EARACHE    Subjective:   Presents for evaluation of sinus pain and pressure. Started one week ago with pressure and pain in ears and sinuses. Post nasal drip and runny nose for about 2-3 weeks. Sinus headache and congestion on the right side with some jaw pain as well. Pain in right ear and right sided lympnodes. Historically, when the head congestion gets very bad he gets nausea with vertigo and projectile vomiting. About a week ago got dizzy and felt nauseas but didn't vomit.  Some mild pain with swallowing but can still swallow.   He has been taking Alka-seltzer plus cold medicine which usually helps but recently he "felt" like his blood pressure was higher when using the Alka-seltzer so he discontinued.   Denies double vision, vision changes or pain in his eyes.   NKDA  Review of Systems As written above.   Patient Active Problem List   Diagnosis Date Noted  . Decreased sexual ability 03/31/2016  . Preventative health care 03/31/2016  . History of kidney stones 03/31/2016  . Chronic back pain 03/31/2016     Prior to Admission medications   Medication Sig Start Date End Date Taking? Authorizing Provider  sildenafil (VIAGRA) 50 MG tablet Take 1/2 to 1 tablet by mouth 30 minutes prior to intercourse. Patient not taking: Reported on 05/06/2016 04/16/16   Doreene NestKatherine K Clark, NP     No Known Allergies     Objective:  Physical Exam  Constitutional: He is oriented to person, place, and time. He appears well-developed and well-nourished. He is active.  Blood pressure 100/70, pulse 77, temperature 98.7 F (37.1 C), temperature source Oral, resp. rate 16, height 5' 9.5" (1.765 m), weight 199 lb 12.8 oz (90.6 kg), SpO2 97 %.  HENT:  Head: Normocephalic.    Right Ear: External ear normal.  Left Ear: External ear normal.  Neck: Trachea normal.    Cardiovascular: Normal rate, regular rhythm and normal heart sounds.   Pulmonary/Chest: Effort normal and breath sounds normal.  Lymphadenopathy:       Head (right side): Submandibular, tonsillar, preauricular and posterior auricular adenopathy present. No occipital adenopathy present.    He has cervical adenopathy.       Right cervical: Superficial cervical adenopathy present.    He has no axillary adenopathy.  Neurological: He is alert and oriented to person, place, and time.  Skin: Skin is warm and dry.  Psychiatric: He has a normal mood and affect. His speech is normal and behavior is normal. Judgment and thought content normal. Cognition and memory are normal.  Vitals reviewed.     Assessment & Plan:  1. Acute maxillary sinusitis - amoxicillin-clavulanate (AUGMENTIN) 875-125 MG tablet; Take 1 tablet by mouth 2 (two) times daily.  Dispense: 20 tablet; Refill: 0 - benzonatate (TESSALON PERLES) 100 MG capsule; Take 1 capsule (100 mg total) by mouth 3 (three) times daily as needed for cough.  Dispense: 20 capsule; Refill: 0  2. Acute upper respiratory infection

## 2016-07-28 NOTE — Progress Notes (Signed)
   Michael Farley  MRN: [id number] DOB: 05-08-1961  PCP: Morrie Sheldonlark,Katherine Kendal, NP  Chief Complaint  Patient presents with  . Cough    GREEN MUCUS X 1 WEEK  . SINUS PRESSURE    WITH EARACHE    Subjective:  Pt presents to clinic for for sinus pressure that has been worsening over the last 2 weeks.  He has chronic allergies that he uses OTC cold treatment and has been treating it for 2 weeks and it is not getting better - he has right sided face pain and dizziness/vertigo for the last several days.  He has PND also.  Review of Systems  Constitutional: Negative for chills and fever.  HENT: Positive for congestion, postnasal drip and sinus pain. Negative for rhinorrhea and sore throat.   Respiratory: Positive for cough (more like clearing - yellow green sputum).   Neurological: Positive for dizziness.    Patient Active Problem List   Diagnosis Date Noted  . Acute upper respiratory infection 07/28/2016  . Decreased sexual ability 03/31/2016  . Preventative health care 03/31/2016  . History of kidney stones 03/31/2016  . Chronic back pain 03/31/2016    Current Outpatient Prescriptions on File Prior to Visit  Medication Sig Dispense Refill  . sildenafil (VIAGRA) 50 MG tablet Take 1/2 to 1 tablet by mouth 30 minutes prior to intercourse. (Patient not taking: Reported on 05/06/2016) 10 tablet 1   No current facility-administered medications on file prior to visit.     No Known Allergies  Pt patients past, family and social history were reviewed and updated.   Objective:  BP 100/70 (BP Location: Right Arm, Patient Position: Sitting, Cuff Size: Large)   Pulse 77   Temp 98.7 F (37.1 C) (Oral)   Resp 16   Ht 5' 9.5" (1.765 m)   Wt 199 lb 12.8 oz (90.6 kg)   SpO2 97%   BMI 29.08 kg/m   Physical Exam  Constitutional: He is oriented to person, place, and time and well-developed, well-nourished, and in no distress.  HENT:  Head: Normocephalic and atraumatic.  Right Ear:  Hearing, tympanic membrane, external ear and ear canal normal.  Left Ear: Hearing, tympanic membrane, external ear and ear canal normal.  Nose: Right sinus exhibits maxillary sinus tenderness.  Mouth/Throat: Uvula is midline, oropharynx is clear and moist and mucous membranes are normal.  Eyes: Conjunctivae are normal.  Neck: Normal range of motion.  Cardiovascular: Normal rate, regular rhythm and normal heart sounds.   Pulmonary/Chest: Effort normal and breath sounds normal. He has no wheezes.  Lymphadenopathy:       Head (right side): No tonsillar adenopathy present.       Head (left side): No tonsillar adenopathy present.    He has no cervical adenopathy.       Right: No supraclavicular adenopathy present.       Left: No supraclavicular adenopathy present.  Neurological: He is alert and oriented to person, place, and time. Gait normal.  Skin: Skin is warm and dry.  Psychiatric: Mood, memory, affect and judgment normal.    Assessment and Plan :  Acute maxillary sinusitis - Plan: amoxicillin-clavulanate (AUGMENTIN) 875-125 MG tablet, benzonatate (TESSALON PERLES) 100 MG capsule  Acute upper respiratory infection - continue symptomatic treatment -   Benny LennertSarah Maebelle Sulton PA-C  [facility] 07/28/2016 3:21 PM

## 2016-08-19 ENCOUNTER — Other Ambulatory Visit: Payer: Self-pay | Admitting: Physician Assistant

## 2016-08-19 DIAGNOSIS — J320 Chronic maxillary sinusitis: Secondary | ICD-10-CM

## 2016-08-19 NOTE — Telephone Encounter (Signed)
Spoke with patient. Advised that he needs re-evaluation if he believes that he needs a refill of Augmentin. He believes that if he had seen his PCP initially, she would have been willing to refill the medication without another office visit. Is not sure he is willing to pay again for another visit, and declines to schedule follow up.

## 2016-08-19 NOTE — Telephone Encounter (Signed)
Pt needs a refill on amoxicillin-clavulanate (AUGMENTIN) 875-125 MG tablet [[id number]] pt uses [facility] on [address]  Please advise pt: [phone]

## 2016-08-20 NOTE — Telephone Encounter (Signed)
I do not prescribe or refill antibiotics without an exam. Thanks for seeing him!

## 2016-08-24 ENCOUNTER — Encounter: Payer: Self-pay | Admitting: Family Medicine

## 2016-08-24 ENCOUNTER — Ambulatory Visit (INDEPENDENT_AMBULATORY_CARE_PROVIDER_SITE_OTHER): Payer: Self-pay | Admitting: Family Medicine

## 2016-08-24 VITALS — BP 128/76 | HR 96 | Temp 97.4°F | Wt 203.0 lb

## 2016-08-24 DIAGNOSIS — J069 Acute upper respiratory infection, unspecified: Secondary | ICD-10-CM

## 2016-08-24 MED ORDER — AZITHROMYCIN 250 MG PO TABS: ORAL_TABLET | ORAL | 0 refills | Status: DC

## 2016-08-24 MED ORDER — MECLIZINE HCL 25 MG PO TABS: 25.0000 mg | ORAL_TABLET | Freq: Three times a day (TID) | ORAL | 0 refills | Status: DC | PRN

## 2016-08-24 NOTE — Assessment & Plan Note (Signed)
Deteriorated now with wheezes on exam s/p course of Augmentin. Treat with zpack. Continue supportive care with antihistamine, tessalon as needed for cough. Call or return to clinic prn if these symptoms worsen or fail to improve as anticipated. The patient indicates understanding of these issues and agrees with the plan.

## 2016-08-24 NOTE — Patient Instructions (Signed)
Great to meet you. Please take Zpack as directed.   Keep [place] updated.

## 2016-08-24 NOTE — Progress Notes (Signed)
Subjective:   Patient ID: Michael Farley, male    DOB: [birth year], 56 y.o.   MRN: [id number]  Michael Farley is a pleasat 56 y.o. year old male pt of Mayra Reel, new to me, who presents to clinic today with Acute Visit (cough)  on 08/24/2016  HPI:  Michael Farley to UC on 07/28/16 for URI symptoms. Note reviewed. Given Augmentin and Tessalon.  Symptoms did improve in terms of his sinus pressure but now his cough is more productive.  Has had some chills. Current Outpatient Prescriptions on File Prior to Visit  Medication Sig Dispense Refill  . benzonatate (TESSALON PERLES) 100 MG capsule Take 1 capsule (100 mg total) by mouth 3 (three) times daily as needed for cough. 20 capsule 0  . sildenafil (REVATIO) 20 MG tablet TAKE 1/2 TO 1 TABLET BY MOUTH 30 MINUTES PRIOR TO INTERCOURSE 50 tablet 0   No current facility-administered medications on file prior to visit.     No Known Allergies  Past Medical History:  Diagnosis Date  . Back pain   . Kidney stones   . Wolff-Parkinson-White (WPW) syndrome     Past Surgical History:  Procedure Laterality Date  . KNEE SURGERY Left 1985    Family History  Problem Relation Age of Onset  . COPD Mother   . Hypertension Mother   . Alcohol abuse Father   . Diabetes Father   . Cancer Maternal Grandmother   . Prostate cancer Maternal Uncle   . Bladder Cancer Neg Hx   . Kidney cancer Neg Hx     Social History   Social History  . Marital status: Married    Spouse name: N/A  . Number of children: N/A  . Years of education: N/A   Occupational History  . Not on file.   Social History Main Topics  . Smoking status: Never Smoker  . Smokeless tobacco: Never Used  . Alcohol use Yes  . Drug use: No  . Sexual activity: Not on file   Other Topics Concern  . Not on file   Social History Narrative   Single.   1 child.   Works as a Psychologist, sport and exercise.    Enjoys spending time with his girlfriend, spending time at the beach.    The PMH, PSH,  Social History, Family History, Medications, and allergies have been reviewed in [facility], and have been updated if relevant.     Review of Systems  Constitutional: Positive for chills. Negative for fever.  HENT: Positive for congestion, postnasal drip, rhinorrhea and sore throat. Negative for sinus pain.   Respiratory: Positive for cough and wheezing. Negative for shortness of breath and stridor.   Cardiovascular: Negative.   Musculoskeletal: Negative.   Hematological: Negative.   Psychiatric/Behavioral: Negative.   All other systems reviewed and are negative.      Objective:    BP 128/76   Pulse 96   Temp 97.4 F (36.3 C)   Wt 203 lb (92.1 kg)   BMI 29.55 kg/m    Physical Exam  Constitutional: He is oriented to person, place, and time. He appears well-developed and well-nourished. No distress.  HENT:  Head: Normocephalic.  Right Ear: Hearing and tympanic membrane normal.  Left Ear: Hearing and tympanic membrane normal.  Nose: Mucosal edema and rhinorrhea present. Right sinus exhibits no maxillary sinus tenderness and no frontal sinus tenderness. Left sinus exhibits no maxillary sinus tenderness and no frontal sinus tenderness.  Pulmonary/Chest: He has wheezes in the right lower field. He  has rhonchi in the right lower field.  Musculoskeletal: Normal range of motion.  Neurological: He is alert and oriented to person, place, and time. No cranial nerve deficit.  Skin: Skin is warm and dry. He is not diaphoretic.  Psychiatric: He has a normal mood and affect. His behavior is normal. Judgment and thought content normal.  Nursing note and vitals reviewed.         Assessment & Plan:   Acute upper respiratory infection No Follow-up on file.

## 2017-01-11 ENCOUNTER — Other Ambulatory Visit: Payer: Self-pay | Admitting: Primary Care

## 2017-01-11 DIAGNOSIS — R6882 Decreased libido: Secondary | ICD-10-CM

## 2017-01-11 NOTE — Telephone Encounter (Signed)
Last filled 07/29/16... Please advise 

## 2017-01-11 NOTE — Telephone Encounter (Signed)
Refills sent to pharmacy. 

## 2017-01-18 NOTE — Telephone Encounter (Signed)
Received prior authorization request for sildenafil. Send the request and waiting for respond.

## 2017-06-16 ENCOUNTER — Other Ambulatory Visit: Payer: Self-pay | Admitting: Primary Care

## 2017-06-16 DIAGNOSIS — R6882 Decreased libido: Secondary | ICD-10-CM

## 2017-06-23 ENCOUNTER — Other Ambulatory Visit: Payer: Self-pay | Admitting: Primary Care

## 2017-06-23 DIAGNOSIS — R6882 Decreased libido: Secondary | ICD-10-CM

## 2017-09-01 ENCOUNTER — Other Ambulatory Visit: Payer: Self-pay | Admitting: Primary Care

## 2017-09-01 DIAGNOSIS — R6882 Decreased libido: Secondary | ICD-10-CM

## 2017-09-08 ENCOUNTER — Other Ambulatory Visit: Payer: Self-pay | Admitting: Primary Care

## 2017-09-08 ENCOUNTER — Telehealth: Payer: Self-pay | Admitting: Primary Care

## 2017-09-08 DIAGNOSIS — R6882 Decreased libido: Secondary | ICD-10-CM

## 2017-09-08 NOTE — Telephone Encounter (Signed)
Copied from CRM [id number]. Topic: Quick Communication - Rx Refill/Question >> Sep 08, 2017  3:46 PM Mickel BaasMcGee, Jase Himmelberger B, NT wrote: Medication: sildenafil (REVATIO) 20 MG tablet  Has the patient contacted their pharmacy? Yes.   (Agent: If no, request that the patient contact the pharmacy for the refill.) Preferred Pharmacy (with phone number or street name): [facility] [address], [place] - [address] Agent: Please be advised that RX refills may take up to 3 business days. We ask that you follow-up with your pharmacy.

## 2017-09-08 NOTE — Telephone Encounter (Signed)
Rx refill has been pended and is awaiting provider review.

## 2018-01-12 ENCOUNTER — Encounter (HOSPITAL_COMMUNITY): Payer: Self-pay

## 2018-01-12 ENCOUNTER — Ambulatory Visit (HOSPITAL_COMMUNITY)
Admission: EM | Admit: 2018-01-12 | Discharge: 2018-01-12 | Disposition: A | Discharge status: Home / Self Care | Payer: Self-pay | Attending: Internal Medicine | Admitting: Internal Medicine

## 2018-01-12 DIAGNOSIS — R81 Glycosuria: Secondary | ICD-10-CM | POA: Insufficient documentation

## 2018-01-12 DIAGNOSIS — R8290 Unspecified abnormal findings in urine: Secondary | ICD-10-CM | POA: Insufficient documentation

## 2018-01-12 DIAGNOSIS — R300 Dysuria: Secondary | ICD-10-CM | POA: Insufficient documentation

## 2018-01-12 LAB — POCT URINALYSIS DIP (DEVICE)
Bilirubin Urine: NEGATIVE
Glucose, UA: 100 mg/dL — AB
Ketones, ur: NEGATIVE mg/dL
Leukocytes, UA: NEGATIVE
Nitrite: NEGATIVE
Protein, ur: NEGATIVE mg/dL
Specific Gravity, Urine: 1.025 (ref 1.005–1.030)
Urobilinogen, UA: 0.2 mg/dL (ref 0.0–1.0)
pH: 6.5 (ref 5.0–8.0)

## 2018-01-12 MED ORDER — DOXYCYCLINE HYCLATE 100 MG PO CAPS: 100.0000 mg | ORAL_CAPSULE | Freq: Two times a day (BID) | ORAL | 0 refills | Status: AC

## 2018-01-12 NOTE — ED Provider Notes (Signed)
[facility]    CSN: [id number] Arrival date & time: 01/12/18  1645     History   Chief Complaint Chief Complaint  Patient presents with  . Urinary Tract Infection    HPI Michael Farley is a 57 y.o. male.   56 year old male presents with pain with urination, discolored urine and strong odor intermittently for the past 3 months. Symptoms have gotten worse this week. Also having slightly lower abdominal discomfort. Denies any fever, hematuria, penile discharge, lesions or penile or testicular pain. He has not taken any medications for symptoms. His girlfriend has had UTI symptoms as well as diverticulitis recently and currently on antibiotics which is helping her symptoms. They do not use condoms. He has had pain with urination before (about 2 years ago) which he thought was due to a kidney stone along with the right lower central abdominal pain. He was referred to a Urologist but never went. He does not smoke or use illicit drugs. No other chronic health issues except has used Sildenafil for erectile dysfunction.   The history is provided by the patient.    Past Medical History:  Diagnosis Date  . Back pain   . Kidney stones   . Wolff-Parkinson-White (WPW) syndrome     Patient Active Problem List   Diagnosis Date Noted  . Acute upper respiratory infection 07/28/2016  . Decreased sexual ability 03/31/2016  . Preventative health care 03/31/2016  . History of kidney stones 03/31/2016  . Chronic back pain 03/31/2016    Past Surgical History:  Procedure Laterality Date  . KNEE SURGERY Left 1985       Home Medications    Prior to Admission medications   Medication Sig Start Date End Date Taking? Authorizing Provider  doxycycline (VIBRAMYCIN) 100 MG capsule Take 1 capsule (100 mg total) by mouth 2 (two) times daily for 7 days. 01/12/18 01/19/18  Sudie Grumbling, NP  sildenafil (REVATIO) 20 MG tablet TAKE 1/2 TO 1 (ONE-HALF TO ONE) TABLET BY MOUTH 30 MINUTES  PRIOR TO INTERCOURSE 06/23/17   Doreene Nest, NP    Family History Family History  Problem Relation Age of Onset  . COPD Mother   . Hypertension Mother   . Alcohol abuse Father   . Diabetes Father   . Cancer Maternal Grandmother   . Prostate cancer Maternal Uncle   . Bladder Cancer Neg Hx   . Kidney cancer Neg Hx     Social History Social History   Tobacco Use  . Smoking status: Never Smoker  . Smokeless tobacco: Never Used  Substance Use Topics  . Alcohol use: Yes  . Drug use: No     Allergies   Patient has no known allergies.   Review of Systems Review of Systems  Constitutional: Negative for activity change, appetite change, chills, fatigue and fever.  Respiratory: Negative for cough, chest tightness, shortness of breath and wheezing.   Cardiovascular: Negative for chest pain.  Gastrointestinal: Positive for abdominal pain (lower central). Negative for blood in stool, constipation, diarrhea, nausea, rectal pain and vomiting.  Genitourinary: Positive for dysuria. Negative for decreased urine volume, difficulty urinating, discharge, flank pain, frequency, genital sores, hematuria, penile pain, penile swelling, scrotal swelling, testicular pain and urgency.  Musculoskeletal: Positive for back pain (thoracic- chronic). Negative for arthralgias and myalgias.  Skin: Negative for color change, rash and wound.  Allergic/Immunologic: Negative for immunocompromised state.  Neurological: Negative for dizziness, syncope, speech difficulty, weakness, light-headedness, numbness and headaches.  Hematological:  Negative for adenopathy. Does not bruise/bleed easily.  Psychiatric/Behavioral: Negative.      Physical Exam Triage Vital Signs ED Triage Vitals [01/12/18 1709]  Enc Vitals Group     BP 117/70     Pulse Rate 71     Resp 20     Temp 98.2 F (36.8 C)     Temp Source Oral     SpO2 97 %     Weight      Height      Head Circumference      Peak Flow      Pain  Score      Pain Loc      Pain Edu?      Excl. in GC?    No data found.  Updated Vital Signs BP 117/70 (BP Location: Left Arm)   Pulse 71   Temp 98.2 F (36.8 C) (Oral)   Resp 20   SpO2 97%   Visual Acuity Right Eye Distance:   Left Eye Distance:   Bilateral Distance:    Right Eye Near:   Left Eye Near:    Bilateral Near:     Physical Exam  Constitutional: He is oriented to person, place, and time. Vital signs are normal. He appears well-developed and well-nourished. He is cooperative. He does not appear ill. No distress.  Patient sitting on exam table in no acute distress (was resting and woke up when provider entered room).   HENT:  Head: Normocephalic and atraumatic.  Eyes: Conjunctivae and EOM are normal.  Neck: Normal range of motion.  Cardiovascular: Normal rate, regular rhythm and normal heart sounds.  No murmur heard. Pulmonary/Chest: Effort normal and breath sounds normal. No respiratory distress. He has no decreased breath sounds. He has no wheezes. He has no rhonchi.  Abdominal: Soft. Normal appearance and bowel sounds are normal. He exhibits no distension and no mass. There is no hepatosplenomegaly. There is tenderness in the suprapubic area. There is no rigidity, no rebound, no guarding and no CVA tenderness. No hernia.  Genitourinary:  Genitourinary Comments: Patient declined genital exam  Musculoskeletal: Normal range of motion.  Neurological: He is alert and oriented to person, place, and time.  Skin: Skin is warm and dry. No rash noted.  Psychiatric: He has a normal mood and affect. His behavior is normal. Judgment and thought content normal.  Vitals reviewed.    UC Treatments / Results  Labs (all labs ordered are listed, but only abnormal results are displayed) Labs Reviewed  POCT URINALYSIS DIP (DEVICE) - Abnormal; Notable for the following components:      Result Value   Glucose, UA 100 (*)    Hgb urine dipstick TRACE (*)    All other components  within normal limits  URINE CULTURE  URINE CYTOLOGY ANCILLARY ONLY    EKG None  Radiology No results found.  Procedures Procedures (including critical care time)  Medications Ordered in UC Medications - No data to display  Initial Impression / Assessment and Plan / UC Course  I have reviewed the triage vital signs and the nursing notes.  Pertinent labs & imaging results that were available during my care of the patient were reviewed by me and considered in my medical decision making (see chart for details).     Reviewed urinalysis results with patient- trace of blood and slight glucose in urine but otherwise normal. Discussed that he may have a UTI but could be other causes of symptoms. Sent urine for culture and for  STD testing. Will start Doxycycline 100mg  twice a day as directed. Increase fluid intake. No sexual intercourse for at least 5 to 7 days.  Discussed that glucose in urine may be a sign of diabetes- may need additional testing. Declines blood work today. Patient has no insurance so unable to afford additional testing. Strongly encouraged to become established with a PCP for further testing (labwork) since previous blood sugars have been normal. Also recommend follow-up with a Urologist if urinary symptoms persist.  Final Clinical Impressions(s) / UC Diagnoses   Final diagnoses:  Dysuria  Abnormal urine odor  Glucosuria     Discharge Instructions     Recommend start Doxycycline 100mg  twice a day as directed. Increase fluid intake. Follow-up pending lab results. May need Urology consult if symptoms do not improve.     ED Prescriptions    Medication Sig Dispense Auth. Provider   doxycycline (VIBRAMYCIN) 100 MG capsule Take 1 capsule (100 mg total) by mouth 2 (two) times daily for 7 days. 14 capsule Sudie GrumblingAmyot, Braxson Hollingsworth Berry, NP     Controlled Substance Prescriptions [place] Controlled Substance Registry consulted? Not Applicable   Sudie Grumblingmyot, Rally Ouch Berry, NP 01/12/18 2115

## 2018-01-12 NOTE — Discharge Instructions (Signed)
Recommend start Doxycycline 100mg  twice a day as directed. Increase fluid intake. Follow-up pending lab results. May need Urology consult if symptoms do not improve.

## 2018-01-12 NOTE — ED Triage Notes (Signed)
Pt presents with urinary tract symptoms; discolored urine, foul smelling and slight discomfort when urinating and after.

## 2018-01-14 LAB — URINE CULTURE
Culture: <10000 — AB
Special Requests: NORMAL

## 2018-01-15 LAB — URINE CYTOLOGY ANCILLARY ONLY
Chlamydia: NEGATIVE
Neisseria Gonorrhea: NEGATIVE
Trichomonas: NEGATIVE

## 2018-06-20 ENCOUNTER — Emergency Department (HOSPITAL_COMMUNITY)
Admission: EM | Admit: 2018-06-20 | Discharge: 2018-06-20 | Disposition: A | Discharge status: Home / Self Care | Payer: Self-pay | Attending: Emergency Medicine | Admitting: Emergency Medicine

## 2018-06-20 ENCOUNTER — Other Ambulatory Visit: Payer: Self-pay

## 2018-06-20 DIAGNOSIS — R112 Nausea with vomiting, unspecified: Secondary | ICD-10-CM | POA: Insufficient documentation

## 2018-06-20 DIAGNOSIS — R197 Diarrhea, unspecified: Secondary | ICD-10-CM | POA: Insufficient documentation

## 2018-06-20 LAB — COMPREHENSIVE METABOLIC PANEL
ALT: 38 U/L (ref 0–44)
AST: 25 U/L (ref 15–41)
Albumin: 4.5 g/dL (ref 3.5–5.0)
Alkaline Phosphatase: 67 U/L (ref 38–126)
Anion gap: 13 (ref 5–15)
BUN: 26 mg/dL — ABNORMAL HIGH (ref 6–20)
CO2: 23 mmol/L (ref 22–32)
Calcium: 9.4 mg/dL (ref 8.9–10.3)
Chloride: 98 mmol/L (ref 98–111)
Creatinine, Ser: 1.01 mg/dL (ref 0.61–1.24)
GFR calc Af Amer: >60 mL/min (ref >60)
GFR calc non Af Amer: >60 mL/min (ref >60)
Glucose, Bld: 147 mg/dL — ABNORMAL HIGH (ref 70–99)
Potassium: 4 mmol/L (ref 3.5–5.1)
Sodium: 134 mmol/L — ABNORMAL LOW (ref 135–145)
Total Bilirubin: 1.2 mg/dL (ref 0.3–1.2)
Total Protein: 8.1 g/dL (ref 6.5–8.1)

## 2018-06-20 LAB — URINALYSIS, ROUTINE W REFLEX MICROSCOPIC
Bilirubin Urine: NEGATIVE
Glucose, UA: NEGATIVE mg/dL
Ketones, ur: NEGATIVE mg/dL
Nitrite: NEGATIVE
Protein, ur: 100 mg/dL — AB
Specific Gravity, Urine: 1.031 — ABNORMAL HIGH (ref 1.005–1.030)
pH: 5 (ref 5.0–8.0)

## 2018-06-20 LAB — LIPASE, BLOOD: Lipase: 22 U/L (ref 11–51)

## 2018-06-20 LAB — CBC
HCT: 52.7 % — ABNORMAL HIGH (ref 39.0–52.0)
Hemoglobin: 17.6 g/dL — ABNORMAL HIGH (ref 13.0–17.0)
MCH: 28.4 pg (ref 26.0–34.0)
MCHC: 33.4 g/dL (ref 30.0–36.0)
MCV: 85 fL (ref 80.0–100.0)
Platelets: 243 10*3/uL (ref 150–400)
RBC: 6.2 MIL/uL — ABNORMAL HIGH (ref 4.22–5.81)
RDW: 13 % (ref 11.5–15.5)
WBC: 14.2 10*3/uL — ABNORMAL HIGH (ref 4.0–10.5)
nRBC: 0 % (ref 0.0–0.2)

## 2018-06-20 MED ORDER — ONDANSETRON HCL 4 MG/2ML IJ SOLN
4.0000 mg | Freq: Once | INTRAMUSCULAR | Status: AC
  Administered 2018-06-20: 4 mg via INTRAVENOUS
  Filled 2018-06-20: qty 2

## 2018-06-20 MED ORDER — SODIUM CHLORIDE 0.9 % IV BOLUS
1000.0000 mL | Freq: Once | INTRAVENOUS | Status: AC
  Administered 2018-06-20: 1000 mL via INTRAVENOUS

## 2018-06-20 MED ORDER — ONDANSETRON 4 MG PO TBDP
4.0000 mg | ORAL_TABLET | Freq: Once | ORAL | Status: AC | PRN
  Administered 2018-06-20: 4 mg via ORAL
  Filled 2018-06-20: qty 1

## 2018-06-20 MED ORDER — SODIUM CHLORIDE 0.9% FLUSH: 3.0000 mL | Freq: Once | INTRAVENOUS | Status: DC

## 2018-06-20 MED ORDER — ONDANSETRON HCL 4 MG PO TABS: 4.0000 mg | ORAL_TABLET | Freq: Four times a day (QID) | ORAL | 0 refills | Status: DC

## 2018-06-20 NOTE — ED Triage Notes (Signed)
Patient c/o N/V/D for 24 hours. Denies CP and SOB.

## 2018-06-20 NOTE — ED Provider Notes (Signed)
[facility] Provider Note   CSN: [id number] Arrival date & time: 06/20/18  0048     History   Chief Complaint Chief Complaint  Patient presents with  . Abdominal Pain    HPI Ayesha Rumpfhillip Worthy is a 58 y.o. male.  58 year old male with prior medical history as detailed below presents for evaluation of nausea, vomiting, and diarrhea.  Symptoms started approximate 24 hours ago.  Patient reports that his last emesis was about 10 hours previously.  He denies associated fever.  He denies abdominal pain.  He reports multiple episodes of vomiting with concurrent loose watery diarrhea.  Symptoms are moderately improved at time of evaluation.  However, patient desires IV fluids for possible dehydration.  The history is provided by the patient and medical records.  Illness  Location:  Nausea, vomiting, diarrhea Severity:  Moderate Onset quality:  Gradual Duration:  24 hours Timing:  Constant Progression:  Improving Chronicity:  New Associated symptoms: no abdominal pain, no fever and no shortness of breath     Past Medical History:  Diagnosis Date  . Back pain   . Kidney stones   . Wolff-Parkinson-White (WPW) syndrome     Patient Active Problem List   Diagnosis Date Noted  . Acute upper respiratory infection 07/28/2016  . Decreased sexual ability 03/31/2016  . Preventative health care 03/31/2016  . History of kidney stones 03/31/2016  . Chronic back pain 03/31/2016    Past Surgical History:  Procedure Laterality Date  . KNEE SURGERY Left 1985        Home Medications    Prior to Admission medications   Medication Sig Start Date End Date Taking? Authorizing Provider  sildenafil (REVATIO) 20 MG tablet TAKE 1/2 TO 1 (ONE-HALF TO ONE) TABLET BY MOUTH 30 MINUTES PRIOR TO INTERCOURSE 06/23/17   Doreene Nestlark, Katherine K, NP    Family History Family History  Problem Relation Age of Onset  . COPD Mother   . Hypertension Mother   . Alcohol  abuse Father   . Diabetes Father   . Cancer Maternal Grandmother   . Prostate cancer Maternal Uncle   . Bladder Cancer Neg Hx   . Kidney cancer Neg Hx     Social History Social History   Tobacco Use  . Smoking status: Never Smoker  . Smokeless tobacco: Never Used  Substance Use Topics  . Alcohol use: Yes  . Drug use: No     Allergies   Patient has no known allergies.   Review of Systems Review of Systems  Constitutional: Negative for fever.  Respiratory: Negative for shortness of breath.   Gastrointestinal: Negative for abdominal pain.  All other systems reviewed and are negative.    Physical Exam Updated Vital Signs BP (!) 127/96 (BP Location: Right Arm)   Pulse (!) 103   Temp 98.2 F (36.8 C) (Oral)   Resp 19   Ht 5\' 8"  (1.727 m)   Wt 90.7 kg   SpO2 100%   BMI 30.41 kg/m   Physical Exam Vitals signs and nursing note reviewed.  Constitutional:      General: He is not in acute distress.    Appearance: He is well-developed.  HENT:     Head: Normocephalic and atraumatic.  Eyes:     Conjunctiva/sclera: Conjunctivae normal.     Pupils: Pupils are equal, round, and reactive to light.  Neck:     Musculoskeletal: Normal range of motion and neck supple.  Cardiovascular:  Rate and Rhythm: Normal rate and regular rhythm.     Heart sounds: Normal heart sounds.  Pulmonary:     Effort: Pulmonary effort is normal. No respiratory distress.     Breath sounds: Normal breath sounds.  Abdominal:     General: There is no distension.     Palpations: Abdomen is soft.     Tenderness: There is no abdominal tenderness.  Musculoskeletal: Normal range of motion.        General: No deformity.  Skin:    General: Skin is warm and dry.  Neurological:     Mental Status: He is alert and oriented to person, place, and time.      ED Treatments / Results  Labs (all labs ordered are listed, but only abnormal results are displayed) Labs Reviewed  COMPREHENSIVE METABOLIC  PANEL - Abnormal; Notable for the following components:      Result Value   Sodium 134 (*)    Glucose, Bld 147 (*)    BUN 26 (*)    All other components within normal limits  CBC - Abnormal; Notable for the following components:   WBC 14.2 (*)    RBC 6.20 (*)    Hemoglobin 17.6 (*)    HCT 52.7 (*)    All other components within normal limits  URINALYSIS, ROUTINE W REFLEX MICROSCOPIC - Abnormal; Notable for the following components:   Color, Urine AMBER (*)    APPearance HAZY (*)    Specific Gravity, Urine 1.031 (*)    Hgb urine dipstick MODERATE (*)    Protein, ur 100 (*)    Leukocytes, UA TRACE (*)    Bacteria, UA RARE (*)    All other components within normal limits  LIPASE, BLOOD    EKG None  Radiology No results found.  Procedures Procedures (including critical care time)  Medications Ordered in ED Medications  sodium chloride flush (NS) 0.9 % injection 3 mL (3 mLs Intravenous Not Given 06/20/18 0747)  sodium chloride 0.9 % bolus 1,000 mL (has no administration in time range)  ondansetron (ZOFRAN) injection 4 mg (has no administration in time range)  ondansetron (ZOFRAN-ODT) disintegrating tablet 4 mg (4 mg Oral Given 06/20/18 0106)     Initial Impression / Assessment and Plan / ED Course  I have reviewed the triage vital signs and the nursing notes.  Pertinent labs & imaging results that were available during my care of the patient were reviewed by me and considered in my medical decision making (see chart for details).     MDM  Screen complete  Patient is presenting for evaluation of nausea and associated diarrhea.  Patient symptoms are likely viral in nature.  Screening labs in the ED today are without significant abnormality.  Patient does feel improved following IV fluids.  He appears to be appropriate discharge.  Strict return precautions given and understood.  Importance of close follow-up was stressed.  Final Clinical Impressions(s) / ED Diagnoses    Final diagnoses:  Diarrhea, unspecified type    ED Discharge Orders         Ordered    ondansetron (ZOFRAN) 4 MG tablet  Every 6 hours     06/20/18 0842           Wynetta FinesMessick, Ilay Capshaw C, MD 06/20/18 [phone]

## 2018-06-20 NOTE — Discharge Instructions (Addendum)
Please return for any problem.  Follow-up with your regular care providers as instructed.  Drink plenty of fluids.

## 2018-06-20 NOTE — ED Notes (Signed)
Patient verbalizes understanding of discharge instructions. Opportunity for questioning and answers were provided. Armband removed by staff, pt discharged from ED. Ambulated out to lobby  

## 2018-07-10 ENCOUNTER — Other Ambulatory Visit: Payer: Self-pay | Admitting: Primary Care

## 2018-07-10 ENCOUNTER — Other Ambulatory Visit (INDEPENDENT_AMBULATORY_CARE_PROVIDER_SITE_OTHER): Payer: Self-pay

## 2018-07-10 DIAGNOSIS — Z1159 Encounter for screening for other viral diseases: Secondary | ICD-10-CM

## 2018-07-10 DIAGNOSIS — Z125 Encounter for screening for malignant neoplasm of prostate: Secondary | ICD-10-CM

## 2018-07-10 DIAGNOSIS — Z0000 Encounter for general adult medical examination without abnormal findings: Secondary | ICD-10-CM

## 2018-07-11 LAB — LIPID PANEL
Cholesterol: 241 mg/dL — ABNORMAL HIGH (ref 0–200)
HDL: 46.7 mg/dL (ref >39.00)
LDL Cholesterol: 161 mg/dL — ABNORMAL HIGH (ref 0–99)
NonHDL: 194.75
Total CHOL/HDL Ratio: 5
Triglycerides: 169 mg/dL — ABNORMAL HIGH (ref 0.0–149.0)
VLDL: 33.8 mg/dL (ref 0.0–40.0)

## 2018-07-11 LAB — HEPATITIS C ANTIBODY
Hepatitis C Ab: NONREACTIVE
SIGNAL TO CUT-OFF: 0.02 (ref <1.00)

## 2018-07-11 LAB — BASIC METABOLIC PANEL
BUN: 20 mg/dL (ref 6–23)
CO2: 28 mEq/L (ref 19–32)
Calcium: 9.7 mg/dL (ref 8.4–10.5)
Chloride: 102 mEq/L (ref 96–112)
Creatinine, Ser: 0.79 mg/dL (ref 0.40–1.50)
GFR: 100.59 mL/min (ref >60.00)
Glucose, Bld: 88 mg/dL (ref 70–99)
Potassium: 4.3 mEq/L (ref 3.5–5.1)
Sodium: 139 mEq/L (ref 135–145)

## 2018-07-11 LAB — HEMOGLOBIN A1C: Hgb A1c MFr Bld: 5.5 % (ref 4.6–6.5)

## 2018-07-11 LAB — PSA: PSA: 2.26 ng/mL (ref 0.10–4.00)

## 2018-07-13 ENCOUNTER — Ambulatory Visit (INDEPENDENT_AMBULATORY_CARE_PROVIDER_SITE_OTHER): Payer: Self-pay | Admitting: Primary Care

## 2018-07-13 ENCOUNTER — Encounter: Payer: Self-pay | Admitting: Primary Care

## 2018-07-13 VITALS — BP 120/82 | HR 78 | Temp 98.2°F | Ht 68.75 in | Wt 218.8 lb

## 2018-07-13 DIAGNOSIS — Z0000 Encounter for general adult medical examination without abnormal findings: Secondary | ICD-10-CM

## 2018-07-13 DIAGNOSIS — Z23 Encounter for immunization: Secondary | ICD-10-CM

## 2018-07-13 DIAGNOSIS — M545 Low back pain: Secondary | ICD-10-CM

## 2018-07-13 DIAGNOSIS — R6882 Decreased libido: Secondary | ICD-10-CM

## 2018-07-13 DIAGNOSIS — Z111 Encounter for screening for respiratory tuberculosis: Secondary | ICD-10-CM

## 2018-07-13 DIAGNOSIS — E785 Hyperlipidemia, unspecified: Secondary | ICD-10-CM

## 2018-07-13 DIAGNOSIS — G8929 Other chronic pain: Secondary | ICD-10-CM

## 2018-07-13 MED ORDER — SILDENAFIL CITRATE 20 MG PO TABS: ORAL_TABLET | ORAL | 0 refills | Status: DC

## 2018-07-13 NOTE — Assessment & Plan Note (Signed)
Intermittent, work on stretching and yoga.

## 2018-07-13 NOTE — Assessment & Plan Note (Addendum)
Doing well on sildenafil for which he takes PRN, continue same. Refill sent to pharmacy.  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 Would like to see Urology but will call back when ready.

## 2018-07-13 NOTE — Patient Instructions (Addendum)
Start exercising. You should be getting 150 minutes of moderate intensity exercise weekly.  It's important to improve your diet by reducing consumption of fast food, fried food, processed snack foods, sugary drinks. Increase consumption of fresh vegetables and fruits, whole grains, water.  Ensure you are drinking 64 ounces of water daily.  Return for your TB skin test read as discussed.  Schedule a lab only appointment for 6 months to repeat your cholesterol.   We will see you in one year for your annual exam or sooner if needed.  It was a pleasure to see you today!   Preventive Care 40-64 Years, Male Preventive care refers to lifestyle choices and visits with your health care provider that can promote health and wellness. What does preventive care include?   A yearly physical exam. This is also called an annual well check.  Dental exams once or twice a year.  Routine eye exams. Ask your health care provider how often you should have your eyes checked.  Personal lifestyle choices, including: ? Daily care of your teeth and gums. ? Regular physical activity. ? Eating a healthy diet. ? Avoiding tobacco and drug use. ? Limiting alcohol use. ? Practicing safe sex. ? Taking low-dose aspirin every day starting at age 72. What happens during an annual well check? The services and screenings done by your health care provider during your annual well check will depend on your age, overall health, lifestyle risk factors, and family history of disease. Counseling Your health care provider may ask you questions about your:  Alcohol use.  Tobacco use.  Drug use.  Emotional well-being.  Home and relationship well-being.  Sexual activity.  Eating habits.  Work and work Statistician. Screening You may have the following tests or measurements:  Height, weight, and BMI.  Blood pressure.  Lipid and cholesterol levels. These may be checked every 5 years, or more frequently if  you are over 68 years old.  Skin check.  Lung cancer screening. You may have this screening every year starting at age 57 if you have a 30-pack-year history of smoking and currently smoke or have quit within the past 15 years.  Colorectal cancer screening. All adults should have this screening starting at age 42 and continuing until age 47. Your health care provider may recommend screening at age 54. You will have tests every 1-10 years, depending on your results and the type of screening test. People at increased risk should start screening at an earlier age. Screening tests may include: ? Guaiac-based fecal occult blood testing. ? Fecal immunochemical test (FIT). ? Stool DNA test. ? Virtual colonoscopy. ? Sigmoidoscopy. During this test, a flexible tube with a tiny camera (sigmoidoscope) is used to examine your rectum and lower colon. The sigmoidoscope is inserted through your anus into your rectum and lower colon. ? Colonoscopy. During this test, a long, thin, flexible tube with a tiny camera (colonoscope) is used to examine your entire colon and rectum.  Prostate cancer screening. Recommendations will vary depending on your family history and other risks.  Hepatitis C blood test.  Hepatitis B blood test.  Sexually transmitted disease (STD) testing.  Diabetes screening. This is done by checking your blood sugar (glucose) after you have not eaten for a while (fasting). You may have this done every 1-3 years. Discuss your test results, treatment options, and if necessary, the need for more tests with your health care provider. Vaccines Your health care provider may recommend certain vaccines, such as:  Influenza vaccine. This is recommended every year.  Tetanus, diphtheria, and acellular pertussis (Tdap, Td) vaccine. You may need a Td booster every 10 years.  Varicella vaccine. You may need this if you have not been vaccinated.  Zoster vaccine. You may need this after age  40.  Measles, mumps, and rubella (MMR) vaccine. You may need at least one dose of MMR if you were born in 1957 or later. You may also need a second dose.  Pneumococcal 13-valent conjugate (PCV13) vaccine. You may need this if you have certain conditions and have not been vaccinated.  Pneumococcal polysaccharide (PPSV23) vaccine. You may need one or two doses if you smoke cigarettes or if you have certain conditions.  Meningococcal vaccine. You may need this if you have certain conditions.  Hepatitis A vaccine. You may need this if you have certain conditions or if you travel or work in places where you may be exposed to hepatitis A.  Hepatitis B vaccine. You may need this if you have certain conditions or if you travel or work in places where you may be exposed to hepatitis B.  Haemophilus influenzae type b (Hib) vaccine. You may need this if you have certain risk factors. Talk to your health care provider about which screenings and vaccines you need and how often you need them. This information is not intended to replace advice given to you by your health care provider. Make sure you discuss any questions you have with your health care provider. Document Released: 06/05/2015 Document Revised: 06/29/2017 Document Reviewed: 03/10/2015 Elsevier Interactive Patient Education  2019 [employer].

## 2018-07-13 NOTE — Assessment & Plan Note (Signed)
Recent LDL of 161 which is an improvement from low 200's in 2017. ASCVD risk score of 8.3%, he would like to work on diet which is acceptable given his improvement from 2017. We will continue to closely monitor, he will work on lifestyle changes.

## 2018-07-13 NOTE — Addendum Note (Signed)
Addended by: Tawnya Crook on: 07/13/2018 10:55 AM   Modules accepted: Orders

## 2018-07-13 NOTE — Progress Notes (Signed)
Subjective:    Patient ID: Michael Farley, male    DOB: 1961/02/16, 58 y.o.   MRN: [id number]  HPI  Michael Farley is a 58 year old male who presents today for complete physical.  Immunizations: -Tetanus: Unsure -Influenza: Declines    Diet: He endorses a poor diet. Breakfast: Cereal, oatmeal, smoothies Lunch: Cafeteria food Dinner: Protein, vegetable, starch, restaurants  Snacks: Occasionally fruit  Desserts: Daily  Beverages: Water, milk, rarely sweet tea  Exercise: He is not exercising Eye exam: Completed in several years ago  Dental exam: No recent exam Colonoscopy: Completed in 2014, due in 2024 PSA: 2.26 Hep C: Non reactive  The 10-year ASCVD risk score Denman George DC Jr., et al., 2013) is: 8.3%   Values used to calculate the score:     Age: 72 years     Sex: Male     Is Non-Hispanic African American: No     Diabetic: No     Tobacco smoker: No     Systolic Blood Pressure: 120 mmHg     Is BP treated: No     HDL Cholesterol: 46.7 mg/dL     Total Cholesterol: 241 mg/dL  BP Readings from Last 3 Encounters:  07/13/18 120/82  06/20/18 126/80  01/12/18 117/70     Review of Systems  Constitutional: Negative for unexpected weight change.  HENT: Negative for rhinorrhea.   Respiratory: Negative for cough and shortness of breath.   Cardiovascular: Negative for chest pain.  Gastrointestinal: Negative for constipation and diarrhea.  Genitourinary: Negative for difficulty urinating.  Musculoskeletal:       Intermittent arthralgias to knees, chronic  Skin: Negative for rash.  Allergic/Immunologic: Negative for environmental allergies.  Neurological: Negative for dizziness, numbness and headaches.  Psychiatric/Behavioral: The patient is not nervous/anxious.        Past Medical History:  Diagnosis Date  . Back pain   . Kidney stones   . Wolff-Parkinson-White (WPW) syndrome      Social History   Socioeconomic History  . Marital status: Married    Spouse  name: Not on file  . Number of children: Not on file  . Years of education: Not on file  . Highest education level: Not on file  Occupational History  . Not on file  Social Needs  . Financial resource strain: Not on file  . Food insecurity:    Worry: Not on file    Inability: Not on file  . Transportation needs:    Medical: Not on file    Non-medical: Not on file  Tobacco Use  . Smoking status: Never Smoker  . Smokeless tobacco: Never Used  Substance and Sexual Activity  . Alcohol use: Yes  . Drug use: No  . Sexual activity: Not on file  Lifestyle  . Physical activity:    Days per week: Not on file    Minutes per session: Not on file  . Stress: Not on file  Relationships  . Social connections:    Talks on phone: Not on file    Gets together: Not on file    Attends religious service: Not on file    Active member of club or organization: Not on file    Attends meetings of clubs or organizations: Not on file    Relationship status: Not on file  . Intimate partner violence:    Fear of current or ex partner: Not on file    Emotionally abused: Not on file    Physically abused: Not on  file    Forced sexual activity: Not on file  Other Topics Concern  . Not on file  Social History Narrative   Single.   1 child.   Works as a Psychologist, sport and exercise.    Enjoys spending time with his girlfriend, spending time at the beach.     Past Surgical History:  Procedure Laterality Date  . KNEE SURGERY Left 1985    Family History  Problem Relation Age of Onset  . COPD Mother   . Hypertension Mother   . Alcohol abuse Father   . Diabetes Father   . Cancer Maternal Grandmother   . Prostate cancer Maternal Uncle   . Bladder Cancer Neg Hx   . Kidney cancer Neg Hx     No Known Allergies  Current Outpatient Medications on File Prior to Visit  Medication Sig Dispense Refill  . ondansetron (ZOFRAN) 4 MG tablet Take 1 tablet (4 mg total) by mouth every 6 (six) hours. (Patient not taking:  Reported on 07/13/2018) 12 tablet 0   No current facility-administered medications on file prior to visit.     BP 120/82   Pulse 78   Temp 98.2 F (36.8 C) (Oral)   Ht 5' 8.75" (1.746 m)   Wt 218 lb 12 oz (99.2 kg)   SpO2 98%   BMI 32.54 kg/m    Objective:   Physical Exam  Constitutional: He is oriented to person, place, and time. He appears well-nourished.  HENT:  Mouth/Throat: No oropharyngeal exudate.  Eyes: Pupils are equal, round, and reactive to light. EOM are normal.  Neck: Neck supple. No thyromegaly present.  Cardiovascular: Normal rate and regular rhythm.  Respiratory: Effort normal and breath sounds normal.  GI: Soft. Bowel sounds are normal. There is no abdominal tenderness.  Musculoskeletal: Normal range of motion.  Neurological: He is alert and oriented to person, place, and time.  Skin: Skin is warm and dry.  Psychiatric: He has a normal mood and affect.           Assessment & Plan:

## 2018-07-13 NOTE — Assessment & Plan Note (Signed)
Tetanus due, provided today, declines influenza vaccination. PSA UTD. Colonoscopy UTD, due approximately in 2024. Recommended to work on his diet, start with regular exercise.  Exam unremarkable.  Labs reviewed. Follow up in 1 year for CPE.

## 2018-07-16 ENCOUNTER — Encounter: Payer: Self-pay | Admitting: Primary Care

## 2018-07-16 LAB — TB SKIN TEST
Induration: 0 mm
TB Skin Test: NEGATIVE

## 2018-12-23 ENCOUNTER — Other Ambulatory Visit: Payer: Self-pay | Admitting: Primary Care

## 2018-12-23 DIAGNOSIS — E785 Hyperlipidemia, unspecified: Secondary | ICD-10-CM

## 2019-01-11 ENCOUNTER — Other Ambulatory Visit: Payer: Self-pay

## 2019-05-08 ENCOUNTER — Other Ambulatory Visit: Payer: Self-pay | Admitting: Primary Care

## 2019-05-08 DIAGNOSIS — R6882 Decreased libido: Secondary | ICD-10-CM

## 2019-10-16 ENCOUNTER — Encounter (HOSPITAL_COMMUNITY): Payer: Self-pay

## 2019-10-16 ENCOUNTER — Other Ambulatory Visit: Payer: Self-pay

## 2019-10-16 ENCOUNTER — Ambulatory Visit (HOSPITAL_COMMUNITY)
Admission: EM | Admit: 2019-10-16 | Discharge: 2019-10-16 | Disposition: A | Discharge status: Home / Self Care | Payer: Self-pay | Attending: Emergency Medicine | Admitting: Emergency Medicine

## 2019-10-16 DIAGNOSIS — H60501 Unspecified acute noninfective otitis externa, right ear: Secondary | ICD-10-CM

## 2019-10-16 DIAGNOSIS — H6504 Acute serous otitis media, recurrent, right ear: Secondary | ICD-10-CM

## 2019-10-16 MED ORDER — AMOXICILLIN-POT CLAVULANATE 875-125 MG PO TABS: 1.0000 | ORAL_TABLET | Freq: Two times a day (BID) | ORAL | 0 refills | Status: AC

## 2019-10-16 MED ORDER — NEOMYCIN-POLYMYXIN-HC 3.5-10000-1 OT SUSP: 4.0000 [drp] | Freq: Four times a day (QID) | OTIC | 0 refills | Status: AC

## 2019-10-16 MED ORDER — MUPIROCIN 2 % EX OINT: 1.0000 "application " | TOPICAL_OINTMENT | Freq: Three times a day (TID) | CUTANEOUS | 0 refills | Status: DC

## 2019-10-16 NOTE — ED Triage Notes (Signed)
Pt presents with chronic bilateral ear pain and drainage; pt also has recurrent chronic dizziness.

## 2019-10-16 NOTE — Discharge Instructions (Addendum)
Put the Bactroban on your external ear.  Use the eardrops for the abrasion in your ear canal.  Start an antihistamine/decongestant combination such as Claritin-D, Allegra-D, Zyrtec-D.  Start Flonase, saline nasal irrigation with a Lloyd Huger med rinse and distilled water.  Complete the antibiotics.  Follow-up with ENT as soon as you can.  Below is a list of primary care practices who are taking new patients for you to follow-up with.  [facility] internal medicine clinic Ground Floor - [facility], [address] [place], [place], [place] [address] [phone]  [facility] at [facility] [address] [place], [place] [address] [phone]  [facility] [address] [place], [place] [address] [phone]  [facility] [phone] [address] [place] [address]  [facility]: [address] [place] [address]  [phone]  [facility]: [address] [place] [address]   [phone]  [facility]: [address] [place] [address]  [phone]  [facility] : [address] [place] [address] [phone]  [facility]: [address] [place] [address] [phone]  [facility]: [address] [place] [address] [phone]  Dr. Oneal Grout [address] [facility] [place] [address]  [phone]  Dr. Jackie Plum, [facility]. [address] [place], [place] [address]  [phone]  Go to www.goodrx.com to look up your medications. This will give you a list of where you can find your prescriptions at the most affordable prices. Or ask the pharmacist what the cash price is, or if they have any  other discount programs available to help make your medication more affordable. This can be less expensive than what you would pay with insurance.

## 2019-10-16 NOTE — ED Provider Notes (Signed)
HPI  SUBJECTIVE:  Michael Farley is a 59 y.o. male who presents with 3 to 4 days of right ear pain, swollen cervical lymph nodes, dizziness described as "feeling unsteady."  Reports decreased hearing.  Denies vertigo.  No fevers, cough, nasal congestion, rhinorrhea, sinus pain or pressure.  Questionable postnasal drip.  He also reports 2 to 3 months of clear otorrhea, ear crusting and itching.  This has not changed.  He reports external ear canal swelling.  He has been using Q-tips to clean out the fluid.  He has also been taking antihistamines and ibuprofen without improvement in his symptoms.  His dizziness is worse with bending forward, neck extension and moving his head quickly.  No antipyretic in the past 4 to 6 hours.  He has a past medical history of Wolff-Parkinson-White, chronic sinusitis, vertigo for which she has been evaluated by ENT and "ear trouble" for years.  No history of diabetes, hypertension.  ENT: None.  PMD: Cannot remember.    Past Medical History:  Diagnosis Date  . Back pain   . Kidney stones   . Wolff-Parkinson-White (WPW) syndrome     Past Surgical History:  Procedure Laterality Date  . KNEE SURGERY Left 1985    Family History  Problem Relation Age of Onset  . COPD Mother   . Hypertension Mother   . Alcohol abuse Father   . Diabetes Father   . Cancer Maternal Grandmother   . Prostate cancer Maternal Uncle   . Bladder Cancer Neg Hx   . Kidney cancer Neg Hx     Social History   Tobacco Use  . Smoking status: Never Smoker  . Smokeless tobacco: Never Used  Substance Use Topics  . Alcohol use: Yes  . Drug use: No    No current facility-administered medications for this encounter.  Current Outpatient Medications:  .  amoxicillin-clavulanate (AUGMENTIN) 875-125 MG tablet, Take 1 tablet by mouth 2 (two) times daily for 7 days., Disp: 14 tablet, Rfl: 0 .  mupirocin ointment (BACTROBAN) 2 %, Apply 1 application topically 3 (three) times daily., Disp:  22 g, Rfl: 0 .  neomycin-polymyxin-hydrocortisone (CORTISPORIN) 3.5-10000-1 OTIC suspension, Place 4 drops into the right ear 4 (four) times daily for 7 days., Disp: 7.5 mL, Rfl: 0 .  sildenafil (REVATIO) 20 MG tablet, TAKE 1/2 TO 1 TABLET BY MOUTH 30 MINUTES PRIOR TO INTERCOURSE, Disp: 50 tablet, Rfl: 0  No Known Allergies   ROS  As noted in HPI.   Physical Exam  BP (!) 136/92 (BP Location: Left Arm)   Pulse 82   Temp 98.2 F (36.8 C) (Oral)   Resp 18   SpO2 96%    No data found.  No data found.   Constitutional: Well developed, well nourished, no acute distress Eyes:  EOMI, conjunctiva normal bilaterally HENT: Normocephalic, atraumatic,mucus membranes moist Abrasion right external ear.  Positive crusting.  Pain with traction on pinna, palpation of tragus.  No tenderness over the mastoid.  Positive abrasion in the right EAC.  Positive serous fluid behind the TM.  TM appears intact.  Hearing grossly intact and equal bilaterally.  Left TM normal. Neck: Positive cervical lymphadenopathy right side Respiratory: Normal inspiratory effort Cardiovascular: Normal rate GI: nondistended skin: No rash, skin intact Musculoskeletal: no deformities Neurologic: Alert & oriented x 3, no focal neuro deficits Psychiatric: Speech and behavior appropriate   ED Course   Medications - No data to display  Orders Placed This Encounter  Procedures  . Orthostatic vital  signs    Standing Status:   Standing    Number of Occurrences:   1    No results found for this or any previous visit (from the past 24 hour(s)). No results found.  ED Clinical Impression  1. Recurrent acute serous otitis media of right ear   2. Acute otitis externa of right ear, unspecified type      ED Assessment/Plan  Patient has an abrasion on his right external ear which could be contributing to the otorrhea.  Will send home with Bactroban. He also has an abrasion in his right external ear canal, so we will  send home with Cortisporin. He has a serous otitis media, is likely from chronic eustachian tube dysfunction.  Suspect that this is the cause of his dizziness.  We will have him switch from regular antihistamine to antihistamine/decongestant combination such as Claritin Allegra or Zyrtec-D.  Flonase.  7 days of Augmentin.  Patient borderline orthostatic. Referral to [facility].  Discussed MDM, treatment plan, and plan for follow-up with patient. Discussed sn/sx that should prompt return to the ED. patient agrees with plan.   Meds ordered this encounter  Medications  . neomycin-polymyxin-hydrocortisone (CORTISPORIN) 3.5-10000-1 OTIC suspension    Sig: Place 4 drops into the right ear 4 (four) times daily for 7 days.    Dispense:  7.5 mL    Refill:  0  . amoxicillin-clavulanate (AUGMENTIN) 875-125 MG tablet    Sig: Take 1 tablet by mouth 2 (two) times daily for 7 days.    Dispense:  14 tablet    Refill:  0  . mupirocin ointment (BACTROBAN) 2 %    Sig: Apply 1 application topically 3 (three) times daily.    Dispense:  22 g    Refill:  0    *This clinic note was created using [employer]obby[employer]. Therefore, there may be occasional mistakes despite careful proofreading.   ?    Melynda Ripple, MD 10/18/19 [phone]

## 2020-03-20 ENCOUNTER — Other Ambulatory Visit: Payer: Self-pay | Admitting: Primary Care

## 2020-03-20 DIAGNOSIS — R6882 Decreased libido: Secondary | ICD-10-CM

## 2020-03-21 NOTE — Telephone Encounter (Signed)
I have not seen patient since February 2020. Please kindly notify patient that I cannot refill his medication until he's seen. We require a once annual visit for refills.

## 2020-03-21 NOTE — Telephone Encounter (Signed)
LAST APPOINTMENT DATE: 07/05/2019   NEXT APPOINTMENT DATE: Visit date not found    LAST REFILL: 05/09/2019  QTY: #50 no refills

## 2020-03-24 MED ORDER — SILDENAFIL CITRATE 20 MG PO TABS: ORAL_TABLET | ORAL | 0 refills | Status: DC

## 2020-03-24 NOTE — Telephone Encounter (Signed)
Called patient to schedule cpe and labs. Scheduled for 12/1. Sending to PCP for script approval.

## 2020-03-24 NOTE — Telephone Encounter (Signed)
Refill provided, but he must be seen in December as scheduled for further refills. I've not seen him since February 2020. FYI Joellen.

## 2020-04-12 ENCOUNTER — Other Ambulatory Visit: Payer: Self-pay | Admitting: Primary Care

## 2020-04-12 DIAGNOSIS — E785 Hyperlipidemia, unspecified: Secondary | ICD-10-CM

## 2020-04-12 DIAGNOSIS — Z125 Encounter for screening for malignant neoplasm of prostate: Secondary | ICD-10-CM

## 2020-04-15 ENCOUNTER — Other Ambulatory Visit (INDEPENDENT_AMBULATORY_CARE_PROVIDER_SITE_OTHER): Payer: Self-pay

## 2020-04-15 ENCOUNTER — Other Ambulatory Visit: Payer: Self-pay

## 2020-04-15 DIAGNOSIS — E785 Hyperlipidemia, unspecified: Secondary | ICD-10-CM

## 2020-04-15 DIAGNOSIS — Z125 Encounter for screening for malignant neoplasm of prostate: Secondary | ICD-10-CM

## 2020-04-15 LAB — COMPREHENSIVE METABOLIC PANEL
ALT: 20 U/L (ref 0–53)
AST: 17 U/L (ref 0–37)
Albumin: 4.2 g/dL (ref 3.5–5.2)
Alkaline Phosphatase: 72 U/L (ref 39–117)
BUN: 18 mg/dL (ref 6–23)
CO2: 29 mEq/L (ref 19–32)
Calcium: 9.1 mg/dL (ref 8.4–10.5)
Chloride: 102 mEq/L (ref 96–112)
Creatinine, Ser: 0.76 mg/dL (ref 0.40–1.50)
GFR: 97.91 mL/min (ref >60.00)
Glucose, Bld: 103 mg/dL — ABNORMAL HIGH (ref 70–99)
Potassium: 4.3 mEq/L (ref 3.5–5.1)
Sodium: 137 mEq/L (ref 135–145)
Total Bilirubin: 0.8 mg/dL (ref 0.2–1.2)
Total Protein: 6.7 g/dL (ref 6.0–8.3)

## 2020-04-15 LAB — LIPID PANEL
Cholesterol: 252 mg/dL — ABNORMAL HIGH (ref 0–200)
HDL: 43.4 mg/dL (ref >39.00)
NonHDL: 208.99
Total CHOL/HDL Ratio: 6
Triglycerides: 260 mg/dL — ABNORMAL HIGH (ref 0.0–149.0)
VLDL: 52 mg/dL — ABNORMAL HIGH (ref 0.0–40.0)

## 2020-04-15 LAB — PSA: PSA: 2.11 ng/mL (ref 0.10–4.00)

## 2020-04-15 LAB — CBC
HCT: 46.6 % (ref 39.0–52.0)
Hemoglobin: 15.6 g/dL (ref 13.0–17.0)
MCHC: 33.4 g/dL (ref 30.0–36.0)
MCV: 85 fl (ref 78.0–100.0)
Platelets: 259 10*3/uL (ref 150.0–400.0)
RBC: 5.48 Mil/uL (ref 4.22–5.81)
RDW: 13.9 % (ref 11.5–15.5)
WBC: 8 10*3/uL (ref 4.0–10.5)

## 2020-04-15 LAB — LDL CHOLESTEROL, DIRECT: Direct LDL: 180 mg/dL

## 2020-04-22 ENCOUNTER — Encounter: Payer: Self-pay | Admitting: Primary Care

## 2020-04-22 ENCOUNTER — Ambulatory Visit: Payer: Self-pay | Admitting: Primary Care

## 2020-04-22 ENCOUNTER — Other Ambulatory Visit: Payer: Self-pay

## 2020-04-22 VITALS — BP 128/64 | HR 78 | Temp 98.4°F | Ht 68.75 in | Wt 220.0 lb

## 2020-04-22 DIAGNOSIS — Z0000 Encounter for general adult medical examination without abnormal findings: Secondary | ICD-10-CM

## 2020-04-22 DIAGNOSIS — M545 Low back pain, unspecified: Secondary | ICD-10-CM

## 2020-04-22 DIAGNOSIS — F419 Anxiety disorder, unspecified: Secondary | ICD-10-CM | POA: Insufficient documentation

## 2020-04-22 DIAGNOSIS — N5319 Other ejaculatory dysfunction: Secondary | ICD-10-CM

## 2020-04-22 DIAGNOSIS — Z136 Encounter for screening for cardiovascular disorders: Secondary | ICD-10-CM

## 2020-04-22 DIAGNOSIS — R6882 Decreased libido: Secondary | ICD-10-CM

## 2020-04-22 DIAGNOSIS — E785 Hyperlipidemia, unspecified: Secondary | ICD-10-CM

## 2020-04-22 DIAGNOSIS — F32A Depression, unspecified: Secondary | ICD-10-CM

## 2020-04-22 DIAGNOSIS — G8929 Other chronic pain: Secondary | ICD-10-CM

## 2020-04-22 LAB — POC URINALSYSI DIPSTICK (AUTOMATED)
Bilirubin, UA: NEGATIVE
Blood, UA: NEGATIVE
Glucose, UA: NEGATIVE
Ketones, UA: NEGATIVE
Leukocytes, UA: NEGATIVE
Nitrite, UA: NEGATIVE
Protein, UA: POSITIVE — AB
Spec Grav, UA: 1.015 (ref 1.010–1.025)
Urobilinogen, UA: 0.2 E.U./dL
pH, UA: 7 (ref 5.0–8.0)

## 2020-04-22 MED ORDER — SILDENAFIL CITRATE 20 MG PO TABS: ORAL_TABLET | ORAL | 0 refills | Status: DC

## 2020-04-22 NOTE — Patient Instructions (Signed)
Start exercising. You should be getting 150 minutes of moderate intensity exercise weekly.  It's important to improve your diet by reducing consumption of fast food, fried food, processed snack foods, sugary drinks. Increase consumption of fresh vegetables and fruits, whole grains, water.  Ensure you are drinking 64 ounces of water daily.  You will be contacted regarding your referral to cardoilogy.  Please let us know if you have not been contacted within two weeks.   It was a pleasure to see you today!   Preventive Care 60-24 Years Old, Male Preventive care refers to lifestyle choices and visits with your health care provider that can promote health and wellness. This includes:  A yearly physical exam. This is also called an annual well check.  Regular dental and eye exams.  Immunizations.  Screening for certain conditions.  Healthy lifestyle choices, such as eating a healthy diet, getting regular exercise, not using drugs or products that contain nicotine and tobacco, and limiting alcohol use. What can I expect for my preventive care visit? Physical exam Your health care provider will check:  Height and weight. These may be used to calculate body mass index (BMI), which is a measurement that tells if you are at a healthy weight.  Heart rate and blood pressure.  Your skin for abnormal spots. Counseling Your health care provider may ask you questions about:  Alcohol, tobacco, and drug use.  Emotional well-being.  Home and relationship well-being.  Sexual activity.  Eating habits.  Work and work Statistician. What immunizations do I need?  Influenza (flu) vaccine  This is recommended every year. Tetanus, diphtheria, and pertussis (Tdap) vaccine  You may need a Td booster every 10 years. Varicella (chickenpox) vaccine  You may need this vaccine if you have not already been vaccinated. Zoster (shingles) vaccine  You may need this after age 60. Measles, mumps,  and rubella (MMR) vaccine  You may need at least one dose of MMR if you were born in 1957 or later. You may also need a second dose. Pneumococcal conjugate (PCV13) vaccine  You may need this if you have certain conditions and were not previously vaccinated. Pneumococcal polysaccharide (PPSV23) vaccine  You may need one or two doses if you smoke cigarettes or if you have certain conditions. Meningococcal conjugate (MenACWY) vaccine  You may need this if you have certain conditions. Hepatitis A vaccine  You may need this if you have certain conditions or if you travel or work in places where you may be exposed to hepatitis A. Hepatitis B vaccine  You may need this if you have certain conditions or if you travel or work in places where you may be exposed to hepatitis B. Haemophilus influenzae type b (Hib) vaccine  You may need this if you have certain risk factors. Human papillomavirus (HPV) vaccine  If recommended by your health care provider, you may need three doses over 6 months. You may receive vaccines as individual doses or as more than one vaccine together in one shot (combination vaccines). Talk with your health care provider about the risks and benefits of combination vaccines. What tests do I need? Blood tests  Lipid and cholesterol levels. These may be checked every 5 years, or more frequently if you are over 27 years old.  Hepatitis C test.  Hepatitis B test. Screening  Lung cancer screening. You may have this screening every year starting at age 60 if you have a 30-pack-year history of smoking and currently smoke or have quit  within the past 15 years.  Prostate cancer screening. Recommendations will vary depending on your family history and other risks.  Colorectal cancer screening. All adults should have this screening starting at age 60 and continuing until age 60. Your health care provider may recommend screening at age 53 if you are at increased risk. You will  have tests every 1-10 years, depending on your results and the type of screening test.  Diabetes screening. This is done by checking your blood sugar (glucose) after you have not eaten for a while (fasting). You may have this done every 1-3 years.  Sexually transmitted disease (STD) testing. Follow these instructions at home: Eating and drinking  Eat a diet that includes fresh fruits and vegetables, whole grains, lean protein, and low-fat dairy products.  Take vitamin and mineral supplements as recommended by your health care provider.  Do not drink alcohol if your health care provider tells you not to drink.  If you drink alcohol: ? Limit how much you have to 0-2 drinks a day. ? Be aware of how much alcohol is in your drink. In the [place] one drink equals one 12 oz bottle of beer (355 mL), one 5 oz glass of wine (148 mL), or one 1 oz glass of hard liquor (44 mL). Lifestyle  Take daily care of your teeth and gums.  Stay active. Exercise for at least 30 minutes on 5 or more days each week.  Do not use any products that contain nicotine or tobacco, such as cigarettes, e-cigarettes, and chewing tobacco. If you need help quitting, ask your health care provider.  If you are sexually active, practice safe sex. Use a condom or other form of protection to prevent STIs (sexually transmitted infections).  Talk with your health care provider about taking a low-dose aspirin every day starting at age 60. What's next?  Go to your health care provider once a year for a well check visit.  Ask your health care provider how often you should have your eyes and teeth checked.  Stay up to date on all vaccines. This information is not intended to replace advice given to you by your health care provider. Make sure you discuss any questions you have with your health care provider. Document Revised: 05/03/2018 Document Reviewed: 05/03/2018 Elsevier Patient Education  2020 [employer].

## 2020-04-22 NOTE — Assessment & Plan Note (Signed)
Chronic, overall manages well without treatment. Continue to monitor.

## 2020-04-22 NOTE — Assessment & Plan Note (Signed)
Chronic for the last 1-2 years since personal stress with his ex-wife.  Offered treatment for which he kindly declines. He will update if he changes his mind.

## 2020-04-22 NOTE — Assessment & Plan Note (Signed)
Chronic foul smell to ejaculatory fluid. We will check urine for STDs and infection.  UA today grossly negative  Consider urology evaluation.

## 2020-04-22 NOTE — Progress Notes (Signed)
Subjective:    Patient ID: Michael Farley, male    DOB: 1960-11-26, 59 y.o.   MRN: [id number]  HPI  This visit occurred during the SARS-CoV-2 public health emergency.  Safety protocols were in place, including screening questions prior to the visit, additional usage of staff PPE, and extensive cleaning of exam room while observing appropriate contact time as indicated for disinfecting solutions.   [name] is a 59 year old male who presents today for complete physical.  He would also like to discuss numerous other topics.  He would also like an "ultrasound" of the heart to evaluate his vessels, rule out "widow maker" conditions. He denies a family history of heart disease/stroke. He denies exertional chest pain, but does notice "heart fluttering" sensations and "chest pressure" with anxiety. He's been under a lot of stress over the last 1-2 years. He has never seen cardiology. History of hyperlipidemia with recent LDL of 180.  He does have chronic, intermittent dizziness which has been prevalent for years. He describes this dizziness as "room spinning" sensation that will occur sporadically when he turns his head to the side when laying in bed or turning his head too quickly.  He has been told previously that his dizziness was vertigo and secondary to inner ear disorder. He does notice chronic ear fullness along with dizziness, sometimes will notice ears "drain". Over the last one year he's noticed a different type of dizziness that occurs daily with abrupt position changes or if he leans forward in his seat. This feels different from his chronic dizziness. He's never been evaluated by ENT or cardiology, does not currently use antihistamine.   He would like to discuss chronic anxiety with symptoms of worry, difficulty sleeping, feeling overwhelmed, little interest in anything. He's be under a lot of stress with his personal life over the last 1-2 years. He's never been treated for anxiety or  depression in the past, but would like to discuss treatment options today. He denies SI/HI.  He's also noticed a foul smell from the penis during ejaculation, smells like "rotten popcorn" and occurs every time he ejaculates. He denies noticing this smell with urination. He also denies dysuria, pelvic pressure, penile discharge.  History of vasectomy.  His girlfriend no longer allows him to ejaculate inside of her as the smell is too foul.  He was evaluated for this years ago, but does not remember the results.  BP Readings from Last 3 Encounters:  04/22/20 128/64  10/16/19 (!) 136/92  07/13/18 120/82     Immunizations: -Tetanus: Completed in 2020 -Influenza: Declines  -Shingles: Never completed -Covid-19: Completed series   Diet:  He endorses a fair diet.  Exercise: He is active daily.   Eye exam: No recent exam Dental exam: No recent exam  Colonoscopy: Completed about three years ago.  PSA: 2.11 in 2021 Hep C Screen: Negative   BP Readings from Last 3 Encounters:  04/22/20 128/64  10/16/19 (!) 136/92  07/13/18 120/82    The 10-year ASCVD risk score Denman George DC Jr., et al., 2013) is: 11.7%   Values used to calculate the score:     Age: 41 years     Sex: Male     Is Non-Hispanic African American: No     Diabetic: No     Tobacco smoker: No     Systolic Blood Pressure: 128 mmHg     Is BP treated: No     HDL Cholesterol: 43.4 mg/dL     Total  Cholesterol: 252 mg/dL   Review of Systems  Constitutional: Negative for unexpected weight change.  HENT: Negative for rhinorrhea.   Eyes: Negative for visual disturbance.  Respiratory: Negative for cough and shortness of breath.   Cardiovascular: Negative for chest pain.  Gastrointestinal: Negative for constipation and diarrhea.  Genitourinary: Negative for difficulty urinating.  Musculoskeletal: Positive for arthralgias and back pain. Negative for myalgias.  Skin: Negative for rash.  Allergic/Immunologic: Negative for  environmental allergies.  Neurological: Positive for dizziness. Negative for numbness and headaches.  Psychiatric/Behavioral: The patient is nervous/anxious.        Past Medical History:  Diagnosis Date  . Back pain   . Kidney stones   . Wolff-Parkinson-White (WPW) syndrome      Social History   Socioeconomic History  . Marital status: Married    Spouse name: Not on file  . Number of children: Not on file  . Years of education: Not on file  . Highest education level: Not on file  Occupational History  . Not on file  Tobacco Use  . Smoking status: Never Smoker  . Smokeless tobacco: Never Used  Substance and Sexual Activity  . Alcohol use: Yes  . Drug use: No  . Sexual activity: Not on file  Other Topics Concern  . Not on file  Social History Narrative   Single.   1 child.   Works as a Psychologist, sport and exercise.    Enjoys spending time with his girlfriend, spending time at the beach.    Social Determinants of Health   Financial Resource Strain:   . Difficulty of Paying Living Expenses: Not on file  Food Insecurity:   . Worried About Programme researcher, broadcasting/film/video in the Last Year: Not on file  . Ran Out of Food in the Last Year: Not on file  Transportation Needs:   . Lack of Transportation (Medical): Not on file  . Lack of Transportation (Non-Medical): Not on file  Physical Activity:   . Days of Exercise per Week: Not on file  . Minutes of Exercise per Session: Not on file  Stress:   . Feeling of Stress : Not on file  Social Connections:   . Frequency of Communication with Friends and Family: Not on file  . Frequency of Social Gatherings with Friends and Family: Not on file  . Attends Religious Services: Not on file  . Active Member of Clubs or Organizations: Not on file  . Attends Banker Meetings: Not on file  . Marital Status: Not on file  Intimate Partner Violence:   . Fear of Current or Ex-Partner: Not on file  . Emotionally Abused: Not on file  . Physically  Abused: Not on file  . Sexually Abused: Not on file    Past Surgical History:  Procedure Laterality Date  . KNEE SURGERY Left 1985    Family History  Problem Relation Age of Onset  . COPD Mother   . Hypertension Mother   . Alcohol abuse Father   . Diabetes Father   . Cancer Maternal Grandmother   . Prostate cancer Maternal Uncle   . Bladder Cancer Neg Hx   . Kidney cancer Neg Hx     No Known Allergies  No current outpatient medications on file prior to visit.   No current facility-administered medications on file prior to visit.    BP 128/64   Pulse 78   Temp 98.4 F (36.9 C) (Temporal)   Ht 5' 8.75" (1.746 m)  Wt 220 lb (99.8 kg)   SpO2 96%   BMI 32.73 kg/m    Objective:   Physical Exam HENT:     Right Ear: Tympanic membrane and ear canal normal.     Left Ear: Tympanic membrane and ear canal normal.  Eyes:     Pupils: Pupils are equal, round, and reactive to light.  Cardiovascular:     Rate and Rhythm: Normal rate and regular rhythm.  Pulmonary:     Effort: Pulmonary effort is normal.     Breath sounds: Normal breath sounds.  Abdominal:     General: Bowel sounds are normal.     Palpations: Abdomen is soft.     Tenderness: There is no abdominal tenderness.  Musculoskeletal:        General: Normal range of motion.     Cervical back: Neck supple.  Skin:    General: Skin is warm and dry.  Neurological:     Mental Status: He is alert and oriented to person, place, and time.     Cranial Nerves: No cranial nerve deficit.     Deep Tendon Reflexes:     Reflex Scores:      Patellar reflexes are 2+ on the right side and 2+ on the left side. Psychiatric:        Mood and Affect: Mood normal.            Assessment & Plan:

## 2020-04-22 NOTE — Assessment & Plan Note (Addendum)
Recent lipid panel worse with LDL of 180. ASCVD risk score of 11.7%.  Long discussion regarding his risk for CVD given recent levels and chronically high LDL, especially given his concern for evaluation of CAD.  Denies FH of CVD or hyperlipidemia. Denies exertional chest pain, does have symptoms with anxiety.  Recommended statin therapy, he declines as he'd like to work on diet.    Referral placed to cardiology for evaluation as requested

## 2020-04-22 NOTE — Assessment & Plan Note (Signed)
Declines influenza vaccination today. PSA up-to-date. Colonoscopy up-to-date per patient, we will try to obtain his records. Encouraged a healthy diet and regular exercise.  Exam today as noted. Labs reviewed.

## 2020-04-22 NOTE — Assessment & Plan Note (Addendum)
Doing well on sildenafil 20 mg PRN, typically takes 1-2 tablets per dose. Continue same. Refill provided.

## 2020-04-23 ENCOUNTER — Telehealth: Payer: Self-pay | Admitting: Primary Care

## 2020-04-23 DIAGNOSIS — N5319 Other ejaculatory dysfunction: Secondary | ICD-10-CM

## 2020-04-23 DIAGNOSIS — R42 Dizziness and giddiness: Secondary | ICD-10-CM

## 2020-04-23 NOTE — Telephone Encounter (Signed)
Pt called in wanted to let Jae Dire know she wanted to get a referral for ENT  PLEASE AND THANK YOU

## 2020-04-24 NOTE — Telephone Encounter (Signed)
FYI have called patient made lab app. Orders are in chart. Let know about referral  Denies any blood with ejaculation at this time.

## 2020-04-24 NOTE — Telephone Encounter (Signed)
Did you go over this with him at his visit?

## 2020-04-24 NOTE — Telephone Encounter (Signed)
Yes, I did, forgot to add in my assessment and plan. Will refer to ENT for dizziness.  Also, I spoke with Urology. We need to obtain another UA with Culture this time. He can just come by for this, no appointment with me needed.  Is he seeing any blood with ejaculation?

## 2020-04-26 LAB — TEST AUTHORIZATION

## 2020-04-26 LAB — TRICHOMONAS VAGINALIS RNA, QL,MALES: Trichomonas vaginalis RNA: NOT DETECTED

## 2020-04-26 LAB — C. TRACHOMATIS/N. GONORRHOEAE RNA
C. trachomatis RNA, TMA: NOT DETECTED
N. gonorrhoeae RNA, TMA: NOT DETECTED

## 2020-04-27 ENCOUNTER — Other Ambulatory Visit (INDEPENDENT_AMBULATORY_CARE_PROVIDER_SITE_OTHER): Payer: Self-pay

## 2020-04-27 ENCOUNTER — Other Ambulatory Visit: Payer: Self-pay

## 2020-04-27 DIAGNOSIS — N5319 Other ejaculatory dysfunction: Secondary | ICD-10-CM

## 2020-04-27 LAB — POC URINALSYSI DIPSTICK (AUTOMATED)
Bilirubin, UA: NEGATIVE
Blood, UA: POSITIVE
Glucose, UA: NEGATIVE
Ketones, UA: NEGATIVE
Leukocytes, UA: NEGATIVE
Nitrite, UA: NEGATIVE
Protein, UA: NEGATIVE
Spec Grav, UA: >=1.03 — AB (ref 1.010–1.025)
Urobilinogen, UA: 0.2 E.U./dL
pH, UA: 5 (ref 5.0–8.0)

## 2020-04-27 NOTE — Progress Notes (Signed)
JYT  

## 2020-04-28 LAB — URINE CULTURE
MICRO NUMBER:: 11280949
Result:: NO GROWTH
SPECIMEN QUALITY:: ADEQUATE

## 2020-04-30 ENCOUNTER — Ambulatory Visit: Payer: Self-pay | Admitting: Cardiovascular Disease

## 2020-05-03 ENCOUNTER — Other Ambulatory Visit: Payer: Self-pay | Admitting: Primary Care

## 2020-05-03 DIAGNOSIS — N5319 Other ejaculatory dysfunction: Secondary | ICD-10-CM

## 2020-09-01 ENCOUNTER — Other Ambulatory Visit: Payer: Self-pay | Admitting: Primary Care

## 2020-09-01 DIAGNOSIS — R6882 Decreased libido: Secondary | ICD-10-CM

## 2021-02-27 ENCOUNTER — Emergency Department (HOSPITAL_COMMUNITY)
Admission: EM | Admit: 2021-02-27 | Discharge: 2021-02-27 | Disposition: A | Discharge status: Home / Self Care | Payer: Self-pay | Attending: Emergency Medicine | Admitting: Emergency Medicine

## 2021-02-27 ENCOUNTER — Emergency Department (HOSPITAL_COMMUNITY): Payer: Self-pay

## 2021-02-27 ENCOUNTER — Other Ambulatory Visit: Payer: Self-pay

## 2021-02-27 ENCOUNTER — Encounter (HOSPITAL_COMMUNITY): Payer: Self-pay | Admitting: Emergency Medicine

## 2021-02-27 DIAGNOSIS — R110 Nausea: Secondary | ICD-10-CM | POA: Insufficient documentation

## 2021-02-27 DIAGNOSIS — D72829 Elevated white blood cell count, unspecified: Secondary | ICD-10-CM | POA: Insufficient documentation

## 2021-02-27 DIAGNOSIS — R52 Pain, unspecified: Secondary | ICD-10-CM

## 2021-02-27 DIAGNOSIS — I471 Supraventricular tachycardia: Secondary | ICD-10-CM | POA: Insufficient documentation

## 2021-02-27 LAB — URINALYSIS, ROUTINE W REFLEX MICROSCOPIC
Bacteria, UA: NONE SEEN
Bilirubin Urine: NEGATIVE
Glucose, UA: NEGATIVE mg/dL
Ketones, ur: NEGATIVE mg/dL
Leukocytes,Ua: NEGATIVE
Nitrite: NEGATIVE
Protein, ur: NEGATIVE mg/dL
Specific Gravity, Urine: 1.011 (ref 1.005–1.030)
pH: 5 (ref 5.0–8.0)

## 2021-02-27 LAB — BASIC METABOLIC PANEL
Anion gap: 9 (ref 5–15)
BUN: 21 mg/dL (ref 8–23)
CO2: 25 mmol/L (ref 22–32)
Calcium: 9.2 mg/dL (ref 8.9–10.3)
Chloride: 104 mmol/L (ref 98–111)
Creatinine, Ser: 0.87 mg/dL (ref 0.61–1.24)
GFR, Estimated: >60 mL/min (ref >60)
Glucose, Bld: 142 mg/dL — ABNORMAL HIGH (ref 70–99)
Potassium: 4.2 mmol/L (ref 3.5–5.1)
Sodium: 138 mmol/L (ref 135–145)

## 2021-02-27 LAB — CBC
HCT: 51.1 % (ref 39.0–52.0)
Hemoglobin: 17.4 g/dL — ABNORMAL HIGH (ref 13.0–17.0)
MCH: 29.1 pg (ref 26.0–34.0)
MCHC: 34.1 g/dL (ref 30.0–36.0)
MCV: 85.5 fL (ref 80.0–100.0)
Platelets: 283 10*3/uL (ref 150–400)
RBC: 5.98 MIL/uL — ABNORMAL HIGH (ref 4.22–5.81)
RDW: 12.9 % (ref 11.5–15.5)
WBC: 12.1 10*3/uL — ABNORMAL HIGH (ref 4.0–10.5)
nRBC: 0 % (ref 0.0–0.2)

## 2021-02-27 LAB — TROPONIN I (HIGH SENSITIVITY)
Troponin I (High Sensitivity): 9 ng/L (ref <18)
Troponin I (High Sensitivity): 17 ng/L (ref <18)

## 2021-02-27 LAB — PROTIME-INR
INR: 0.9 (ref 0.8–1.2)
Prothrombin Time: 11.6 seconds (ref 11.4–15.2)

## 2021-02-27 MED ORDER — ADENOSINE 6 MG/2ML IV SOLN
6.0000 mg | Freq: Once | INTRAVENOUS | Status: AC
  Administered 2021-02-27: 6 mg via INTRAVENOUS

## 2021-02-27 MED ORDER — ADENOSINE 6 MG/2ML IV SOLN: 12.0000 mg | Freq: Once | INTRAVENOUS | Status: AC

## 2021-02-27 MED ORDER — ADENOSINE 6 MG/2ML IV SOLN
INTRAVENOUS | Status: AC
  Administered 2021-02-27: 12 mg via INTRAVENOUS
  Filled 2021-02-27: qty 4

## 2021-02-27 MED ORDER — ADENOSINE 6 MG/2ML IV SOLN
INTRAVENOUS | Status: AC
  Filled 2021-02-27: qty 2

## 2021-02-27 NOTE — ED Notes (Signed)
Called xray regarding xray order. Xray coming to bedside

## 2021-02-27 NOTE — ED Notes (Signed)
Vagal maneuvers performed by Dr Eudelia Bunch

## 2021-02-27 NOTE — ED Notes (Signed)
Reviewed discharge instructions with patient. Follow-up care reviewed. Patient verbalized understanding. Patient A&Ox4, VSS, and ambulatory with steady gait upon discharge.  

## 2021-02-27 NOTE — ED Triage Notes (Addendum)
Patient reports worsening central chest pain / tightness with palpitations for several weeks , mild SOB , no emesis or diaphoresis . HR= 197/min at triage .

## 2021-02-27 NOTE — ED Provider Notes (Signed)
[facility] EMERGENCY DEPARTMENT Provider Note  CSN: [id number] Arrival date & time: 02/27/21 6808  Chief Complaint(s) Chest Pain (HR=197/min)  HPI Michael Farley is a 60 y.o. male with a past medical history listed below including reported WPW who presents to the emergency department with sudden onset tachycardia and chest discomfort that began 1 to 2 hours prior to arrival.  Pain and symptoms awoke him from sleep.  Chest pain is described as a deep pressure.  Nonradiating.  No associated shortness of breath.  Nausea without emesis.  No abdominal pain.  He denies any recent fevers or infections.  No cough or congestion.  Denied any use of alcohol last night.  Did admit to using a small amount of marijuana.  Denies any other illicit drug use.   Chest Pain  Past Medical History Past Medical History:  Diagnosis Date  . Back pain   . Kidney stones   . Wolff-Parkinson-White (WPW) syndrome    Patient Active Problem List   Diagnosis Date Noted  . Anxiety and depression 04/22/2020  . Abnormal ejaculation 04/22/2020  . Hyperlipidemia 07/13/2018  . Decreased sexual ability 03/31/2016  . Preventative health care 03/31/2016  . History of kidney stones 03/31/2016  . Chronic back pain 03/31/2016   Home Medication(s) Prior to Admission medications   Medication Sig Start Date End Date Taking? Authorizing Provider  sildenafil (REVATIO) 20 MG tablet TAKE 2 TO 5 TABLETS BY MOUTH AS NEEDED 30  MINUTES  PRIOR  TO  INTERCOURSE 09/01/20   Doreene Nest, NP                                                                                                                                    Past Surgical History Past Surgical History:  Procedure Laterality Date  . KNEE SURGERY Left 1985   Family History Family History  Problem Relation Age of Onset  . COPD Mother   . Hypertension Mother   . Alcohol abuse Father   . Diabetes Father   . Cancer Maternal Grandmother   .  Prostate cancer Maternal Uncle   . Bladder Cancer Neg Hx   . Kidney cancer Neg Hx     Social History Social History   Tobacco Use  . Smoking status: Never  . Smokeless tobacco: Never  Substance Use Topics  . Alcohol use: Yes  . Drug use: No   Allergies Patient has no known allergies.  Review of Systems Review of Systems  Cardiovascular:  Positive for chest pain.  All other systems are reviewed and are negative for acute change except as noted in the HPI  Physical Exam Vital Signs  I have reviewed the triage vital signs BP (!) 132/56   Pulse 196   Temp 97.8 F (36.6 C) (Oral)   Resp 20   Ht 5\' 11"  (1.803 m)   Wt 107 kg   SpO2 98%   BMI  32.90 kg/m   Physical Exam Vitals reviewed.  Constitutional:      General: He is not in acute distress.    Appearance: He is well-developed. He is not diaphoretic.  HENT:     Head: Normocephalic and atraumatic.     Nose: Nose normal.  Eyes:     General: No scleral icterus.       Right eye: No discharge.        Left eye: No discharge.     Conjunctiva/sclera: Conjunctivae normal.     Pupils: Pupils are equal, round, and reactive to light.  Cardiovascular:     Rate and Rhythm: Regular rhythm. Tachycardia present.     Heart sounds: No murmur heard.   No friction rub. No gallop.  Pulmonary:     Effort: Pulmonary effort is normal. No respiratory distress.     Breath sounds: Normal breath sounds. No stridor. No rales.  Abdominal:     General: There is no distension.     Palpations: Abdomen is soft.     Tenderness: There is no abdominal tenderness.  Musculoskeletal:        General: No tenderness.     Cervical back: Normal range of motion and neck supple.  Skin:    General: Skin is warm and dry.     Findings: No erythema or rash.  Neurological:     Mental Status: He is alert and oriented to person, place, and time.    ED Results and Treatments Labs (all labs ordered are listed, but only abnormal results are  displayed) Labs Reviewed  BASIC METABOLIC PANEL - Abnormal; Notable for the following components:      Result Value   Glucose, Bld 142 (*)    All other components within normal limits  CBC - Abnormal; Notable for the following components:   WBC 12.1 (*)    RBC 5.98 (*)    Hemoglobin 17.4 (*)    All other components within normal limits  PROTIME-INR  TROPONIN I (HIGH SENSITIVITY)                                                                                                                         EKG  EKG Interpretation  Date/Time:  Saturday February 27 2021 06:13:35 EDT Ventricular Rate:  192 PR Interval:    QRS Duration: 90 QT Interval:  234 QTC Calculation: 418 R Axis:   -5 Text Interpretation: Supraventricular tachycardia Marked ST abnormality, possible inferior subendocardial injury Abnormal ECG Confirmed by Drema Pry [phone]) on 02/27/2021 6:32:21 AM          EKG Interpretation  Date/Time:  Saturday February 27 2021 07:05:10 EDT Ventricular Rate:  84 PR Interval:  113 QRS Duration: 96 QT Interval:  386 QTC Calculation: 457 R Axis:   16 Text Interpretation: Sinus rhythm Borderline short PR interval Abnormal R-wave progression, early transition Confirmed by Drema Pry [phone]) on 02/27/2021 7:26:31 AM         Radiology No  results found.  Pertinent labs & imaging results that were available during my care of the patient were reviewed by me and considered in my medical decision making (see MDM for details).  Medications Ordered in ED Medications  adenosine (ADENOCARD) 6 MG/2ML injection 12 mg (12 mg Intravenous Given 02/27/21 0654)  adenosine (ADENOCARD) 6 MG/2ML injection 6 mg (6 mg Intravenous Given 02/27/21 0650)                                                                                                                                     Procedures .1-3 Lead EKG Interpretation Performed by: Nira Conn, MD Authorized by: Nira Conn, MD     Interpretation: abnormal     ECG rate:  189   ECG rate assessment: tachycardic     Rhythm: SVT     Ectopy: none     Conduction: normal   .Cardioversion  Date/Time: 02/27/2021 7:31 AM Performed by: Nira Conn, MD Authorized by: Nira Conn, MD   Consent:    Consent obtained:  Verbal and emergent situation   Consent given by:  Patient   Risks discussed:  Cutaneous burn, death, induced arrhythmia and pain   Alternatives discussed:  Delayed treatment, rate-control medication and alternative treatment Pre-procedure details:    Cardioversion basis:  Emergent   Rhythm:  Supraventricular tachycardia   Electrode placement:  Anterior-posterior Patient sedated: No Attempt one:    Cardioversion mode attempt one: Adenosine 6mg .   Shock outcome:  No change in rhythm Attempt two:    Cardioversion mode attempt two: Adenosine 12mg .   Shock outcome:  Conversion to normal sinus rhythm .Critical Care Performed by: , MD Authorized by: , MD   Critical care provider statement:    Critical care time (minutes):  45   Critical care was necessary to treat or prevent imminent or life-threatening deterioration of the following conditions:  Cardiac failure   Critical care was time spent personally by me on the following activities:  Discussions with consultants, evaluation of patient's response to treatment, examination of patient, ordering and performing treatments and interventions, ordering and review of laboratory studies, ordering and review of radiographic studies, pulse oximetry, re-evaluation of patient's condition, obtaining history from patient or surrogate and review of old charts  (including critical care time)  Medical Decision Making / ED Course I have reviewed the nursing notes for this encounter and the patient's prior records (if available in EHR or on provided paperwork).  Michael Farley was  evaluated in Emergency Department on 02/27/2021 for the symptoms described in the history of present illness. He was evaluated in the context of the global COVID-19 pandemic, which necessitated consideration that the patient might be at risk for infection with the SARS-CoV-2 virus that causes COVID-19. Institutional protocols and algorithms that pertain to the evaluation of patients at risk for COVID-19 are in a state of  rapid change based on information released by regulatory bodies including the CDC and federal and state organizations. These policies and algorithms were followed during the patient's care in the ED.  Clinical Course as of 02/27/21 0831  Sat Feb 27, 2021  0786 Patient noted to be in SVT with rates in the 180s to 200s.  Hemodynamically stable. History of WPW.  Initial adenosine conversion unsuccessful.  Patient was converted with 12 mg of adenosine on the second attempt.  Repeat EKG with normal sinus rhythm. Pending labs  [PC]    Clinical Course User Index [PC] Levie Wages, Amadeo Garnet, MD     Pertinent labs & imaging results that were available during my care of the patient were reviewed by me and considered in my medical decision making:  Initial blood work notable for leukocytosis and elevated hemoglobin likely hemoconcentration. No significant electrolyte derangements or renal sufficiency.. Initial Trope negative.  Pending CXR  Final Clinical Impression(s) / ED Diagnoses Final diagnoses:  SVT (supraventricular tachycardia) (HCC)     This chart was dictated using voice recognition software.  Despite best efforts to proofread,  errors can occur which can change the documentation meaning.    Nira Conn, MD 02/27/21 [phone]

## 2021-02-27 NOTE — Discharge Instructions (Addendum)
As discussed, it is very important that you follow up with both your physician and your cardiologist.  Return here for concerning changes in your condition.

## 2021-02-27 NOTE — ED Provider Notes (Signed)
10:50 AM Care of the patient assumed at signout.  On repeat exam the patient has no tachycardia, no SVT, is awake, alert, in no distress.  Final labs reviewed, troponin slightly elevated in comparison to prior value, but with SVT, no evidence for ischemia on repeat EKG.  Urinalysis performed per request, generally unremarkable aside from small hemoglobinuria.  Patient appropriate for outpatient follow-up with both [facility] fib clinic to which she has been referred, and primary care.   Gerhard Munch, MD 02/27/21 1050

## 2021-03-04 ENCOUNTER — Telehealth (HOSPITAL_COMMUNITY): Payer: Self-pay | Admitting: *Deleted

## 2021-03-04 NOTE — Telephone Encounter (Signed)
Received referral from ER visit 10/8 to afib clinic. Per Jorja Loa PA review. Pt with SVT and WPW not afib/aflutter documented. Will forward referral to EP for further evaluation and treatment. Referral forwarded to EP scheduler.

## 2021-03-31 ENCOUNTER — Ambulatory Visit (INDEPENDENT_AMBULATORY_CARE_PROVIDER_SITE_OTHER): Payer: Self-pay | Admitting: Internal Medicine

## 2021-03-31 ENCOUNTER — Encounter: Payer: Self-pay | Admitting: Internal Medicine

## 2021-03-31 ENCOUNTER — Other Ambulatory Visit: Payer: Self-pay

## 2021-03-31 ENCOUNTER — Other Ambulatory Visit: Payer: Self-pay | Admitting: Primary Care

## 2021-03-31 DIAGNOSIS — R6882 Decreased libido: Secondary | ICD-10-CM

## 2021-03-31 DIAGNOSIS — I471 Supraventricular tachycardia: Secondary | ICD-10-CM

## 2021-03-31 NOTE — Patient Instructions (Addendum)
Medication Instructions:  Your physician recommends that you continue on your current medications as directed. Please refer to the Current Medication list given to you today.  Labwork: None ordered.  Testing/Procedures: None ordered.  Follow-Up: Your physician wants you to follow-up in: as needed with Gregg Taylor, MD    Any Other Special Instructions Will Be Listed Below (If Applicable).  If you need a refill on your cardiac medications before your next appointment, please call your pharmacy.       

## 2021-03-31 NOTE — Progress Notes (Signed)
HPI 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 He carries this diagnosis dating back to the 1980's. He has had spells over the year but no syncope recently. He has known SVT. He sought medical attention several weeks ago when he consumed too much caffeine and developed sustained palpitations and was found to have SVT at around 200/min. He was treated with IV adenosine restoring NSR. He remains active and has no limit to physical activity. He almost never has SVT unless he over consumes caffeine or chocolate. In SVT he gets sob and has chest pressure. No syncope.  No Known Allergies   Current Outpatient Medications  Medication Sig Dispense Refill   sildenafil (REVATIO) 20 MG tablet TAKE 2 TO 5 TABLETS BY MOUTH AS NEEDED 30  MINUTES  PRIOR  TO  INTERCOURSE 50 tablet 0   No current facility-administered medications for this visit.     Past Medical History:  Diagnosis Date   Back pain    Kidney stones    Wolff-Parkinson-White (WPW) syndrome     ROS:   All systems reviewed and negative except as noted in the HPI.   Past Surgical History:  Procedure Laterality Date   KNEE SURGERY Left 1985     Family History  Problem Relation Age of Onset   COPD Mother    Hypertension Mother    Alcohol abuse Father    Diabetes Father    Cancer Maternal Grandmother    Prostate cancer Maternal Uncle    Bladder Cancer Neg Hx    Kidney cancer Neg Hx      Social History   Socioeconomic History   Marital status: Married    Spouse name: Not on file   Number of children: Not on file   Years of education: Not on file   Highest education level: Not on file  Occupational History   Not on file  Tobacco Use   Smoking status: Never   Smokeless tobacco: Never  Substance and Sexual Activity   Alcohol use: Yes   Drug use: No   Sexual activity: Not on file  Other Topics Concern   Not on file  Social History Narrative   Single.   1 child.   Works as a Advice worker.    Enjoys spending time with his girlfriend, spending time at the beach.    Social Determinants of Health   Financial Resource Strain: Not on file  Food Insecurity: Not on file  Transportation Needs: Not on file  Physical Activity: Not on file  Stress: Not on file  Social Connections: Not on file  Intimate Partner Violence: Not on file     BP 124/68   Pulse 80   Ht 5\' 10"  (1.778 m)   Wt 223 lb (101.2 kg)   SpO2 94%   BMI 32.00 kg/m   Physical Exam:  Well appearing middle aged man, NAD HEENT: Unremarkable Neck:  6 cm JVD, no thyromegally Lymphatics:  No adenopathy Back:  No CVA tenderness Lungs:  Clear with no wheezes HEART:  Regular rate rhythm, no murmurs, no rubs, no clicks Abd:  soft, positive bowel sounds, no organomegally, no rebound, no guarding Ext:  2 plus pulses, no edema, no cyanosis, no clubbing Skin:  No rashes no nodules Neuro:  CN II through XII intact, motor grossly intact  EKG - nsr with ventricular pre-excitation.  Assess/Plan:  WPW syndrome - I have recommended EP study and catheter ablation but he is  inclined to wait and try to avoid caffeine and stimulants.  SVT - he is not on a beta blocker but not interested at this point.  Sharlot Gowda Dior Stepter,MD

## 2021-08-19 ENCOUNTER — Ambulatory Visit: Payer: Self-pay | Admitting: Family

## 2021-08-19 ENCOUNTER — Encounter: Payer: Self-pay | Admitting: Family

## 2021-08-19 ENCOUNTER — Ambulatory Visit (INDEPENDENT_AMBULATORY_CARE_PROVIDER_SITE_OTHER): Payer: Self-pay | Admitting: Family

## 2021-08-19 VITALS — BP 118/58 | HR 72 | Temp 98.5°F | Resp 16 | Ht 70.0 in | Wt 224.4 lb

## 2021-08-19 DIAGNOSIS — J309 Allergic rhinitis, unspecified: Secondary | ICD-10-CM | POA: Insufficient documentation

## 2021-08-19 DIAGNOSIS — J0110 Acute frontal sinusitis, unspecified: Secondary | ICD-10-CM

## 2021-08-19 DIAGNOSIS — K648 Other hemorrhoids: Secondary | ICD-10-CM | POA: Insufficient documentation

## 2021-08-19 DIAGNOSIS — J302 Other seasonal allergic rhinitis: Secondary | ICD-10-CM

## 2021-08-19 DIAGNOSIS — H8113 Benign paroxysmal vertigo, bilateral: Secondary | ICD-10-CM

## 2021-08-19 HISTORY — DX: Acute frontal sinusitis, unspecified: J01.10

## 2021-08-19 MED ORDER — METHYLPREDNISOLONE 4 MG PO TBPK: ORAL_TABLET | ORAL | 0 refills | Status: DC

## 2021-08-19 MED ORDER — MECLIZINE HCL 25 MG PO TABS: 25.0000 mg | ORAL_TABLET | Freq: Two times a day (BID) | ORAL | 0 refills | Status: AC | PRN

## 2021-08-19 MED ORDER — HYDROCORTISONE (PERIANAL) 2.5 % EX CREA: 1.0000 "application " | TOPICAL_CREAM | Freq: Two times a day (BID) | CUTANEOUS | 1 refills | Status: DC

## 2021-08-19 MED ORDER — MECLIZINE HCL 25 MG PO TABS: 25.0000 mg | ORAL_TABLET | Freq: Two times a day (BID) | ORAL | 0 refills | Status: DC | PRN

## 2021-08-19 MED ORDER — AMOXICILLIN-POT CLAVULANATE 875-125 MG PO TABS
1.0000 | ORAL_TABLET | Freq: Two times a day (BID) | ORAL | 0 refills | Status: DC
Start: 2021-08-19 — End: 2021-11-29

## 2021-08-19 MED ORDER — HYDROCORTISONE (PERIANAL) 2.5 % EX CREA: 1.0000 "application " | TOPICAL_CREAM | Freq: Two times a day (BID) | CUTANEOUS | 1 refills | Status: AC

## 2021-08-19 NOTE — Assessment & Plan Note (Signed)
Suggested pt start daily flonase 50 mcg  ?As well as nightly zyrtec 10 mg  ?

## 2021-08-19 NOTE — Progress Notes (Signed)
? ?Established Patient Office Visit ? ?Subjective:  ?Patient ID: Michael Farley, male    DOB: 1960/11/15  Age: 61 y.o. MRN: [id number] ? ?CC:  ?Chief Complaint  ?Patient presents with  ? Hemorrhoids  ? Dizziness  ? Referral  ?  To ENT  ? ? ?HPI ?Michael Farley is here today with concerns.  ? ?Has had hemorrhoids, but lately in the last few weeks itching is really irritating him. He is noticing hard spots that he describes as rocks that are non-tender, but this seems abnormal to him.  ? ?Whenever he tries to bend over, turning to the side, feels like he is almost about to pass out, room starts spinning, and he feels uncomfortable. He states this has been ongoing for twenty years or so .  ?He does have a CHRONIC history of vertigo that he has used meclizine for in the past. He needs a refill. ? ?He does not notice sinus buildup that goes into the ears, and causes vertigo in the past. He is curious if he should see an ENT.  ? ?Past Medical History:  ?Diagnosis Date  ? Back pain   ? Kidney stones   ? Wolff-Parkinson-White (WPW) syndrome   ? ? ?Past Surgical History:  ?Procedure Laterality Date  ? KNEE SURGERY Left 1985  ? ? ?Family History  ?Problem Relation Age of Onset  ? COPD Mother   ? Hypertension Mother   ? Alcohol abuse Father   ? Diabetes Father   ? Cancer Maternal Grandmother   ? Prostate cancer Maternal Uncle   ? Bladder Cancer Neg Hx   ? Kidney cancer Neg Hx   ? ? ?Social History  ? ?Socioeconomic History  ? Marital status: Married  ?  Spouse name: Not on file  ? Number of children: Not on file  ? Years of education: Not on file  ? Highest education level: Not on file  ?Occupational History  ? Not on file  ?Tobacco Use  ? Smoking status: Never  ? Smokeless tobacco: Never  ?Substance and Sexual Activity  ? Alcohol use: Yes  ? Drug use: No  ? Sexual activity: Not on file  ?Other Topics Concern  ? Not on file  ?Social History Narrative  ? Single.  ? 1 child.  ? Works as a Armed forces operational officer.   ? Enjoys spending  time with his girlfriend, spending time at the beach.   ? ?Social Determinants of Health  ? ?Financial Resource Strain: Not on file  ?Food Insecurity: Not on file  ?Transportation Needs: Not on file  ?Physical Activity: Not on file  ?Stress: Not on file  ?Social Connections: Not on file  ?Intimate Partner Violence: Not on file  ? ? ?Outpatient Medications Prior to Visit  ?Medication Sig Dispense Refill  ? sildenafil (REVATIO) 20 MG tablet TAKE 2-5 TABLETS BY MOUTH AS NEEDED 30  MINUTES  PRIOR  TO  INTERCOURSE. Office visit required for further refills. 50 tablet 0  ? ?No facility-administered medications prior to visit.  ? ? ?No Known Allergies ? ?ROS ?Review of Systems  ?Constitutional:  Negative for chills, fatigue, fever and unexpected weight change.  ?HENT:  Positive for postnasal drip, sinus pressure and sore throat. Negative for congestion, ear discharge, ear pain and hearing loss.   ?Eyes:  Negative for visual disturbance.  ?Respiratory:  Negative for shortness of breath.   ?Cardiovascular:  Negative for chest pain.  ?Gastrointestinal:  Positive for rectal pain (itching). Negative for abdominal pain, anal bleeding,  blood in stool and constipation.  ?Genitourinary:  Negative for difficulty urinating.  ?Skin:  Negative for rash.  ?Neurological:  Positive for dizziness. Negative for headaches.  ? ?  ?Objective:  ?  ?Physical Exam ?Constitutional:   ?   General: He is awake. He is not in acute distress. ?   Appearance: Normal appearance. He is obese. He is not ill-appearing, toxic-appearing or diaphoretic.  ?HENT:  ?   Head: Normocephalic.  ?   Right Ear: No tenderness. A middle ear effusion (clear) is present. Tympanic membrane is not erythematous.  ?   Left Ear: Tympanic membrane normal. Tympanic membrane is not erythematous.  ?   Nose:  ?   Right Turbinates: Not enlarged or swollen.  ?   Left Turbinates: Not enlarged or swollen.  ?   Right Sinus: Frontal sinus tenderness present. No maxillary sinus tenderness.   ?   Left Sinus: Frontal sinus tenderness present. No maxillary sinus tenderness.  ?   Mouth/Throat:  ?   Mouth: Mucous membranes are moist.  ?   Pharynx: Posterior oropharyngeal erythema present. No pharyngeal swelling or oropharyngeal exudate.  ?Eyes:  ?   Extraocular Movements: Extraocular movements intact.  ?   Pupils: Pupils are equal, round, and reactive to light.  ?Cardiovascular:  ?   Rate and Rhythm: Normal rate and regular rhythm.  ?Pulmonary:  ?   Effort: Pulmonary effort is normal.  ?   Breath sounds: Normal breath sounds. No wheezing.  ?Neurological:  ?   General: No focal deficit present.  ?   Mental Status: He is alert and oriented to person, place, and time.  ?Psychiatric:     ?   Mood and Affect: Mood normal.     ?   Behavior: Behavior normal.     ?   Thought Content: Thought content normal.     ?   Judgment: Judgment normal.  ? ? ?BP (!) 118/58   Pulse 72   Temp 98.5 ?F (36.9 ?C)   Resp 16   Ht 5\' 10"  (1.778 m)   Wt 224 lb 7 oz (101.8 kg)   SpO2 94%   BMI 32.20 kg/m?  ?Wt Readings from Last 3 Encounters:  ?08/19/21 224 lb 7 oz (101.8 kg)  ?03/31/21 223 lb (101.2 kg)  ?02/27/21 235 lb 14.3 oz (107 kg)  ? ? ? ?Health Maintenance Due  ?Topic Date Due  ? COVID-19 Vaccine (1) Never done  ? HIV Screening  Never done  ? COLONOSCOPY (Pts 45-37yrs Insurance coverage will need to be confirmed)  Never done  ? Zoster Vaccines- Shingrix (1 of 2) Never done  ? INFLUENZA VACCINE  Never done  ? ? ?There are no preventive care reminders to display for this patient. ? ?No results found for: TSH ?Lab Results  ?Component Value Date  ? WBC 12.1 (H) 02/27/2021  ? HGB 17.4 (H) 02/27/2021  ? HCT 51.1 02/27/2021  ? MCV 85.5 02/27/2021  ? PLT 283 02/27/2021  ? ?Lab Results  ?Component Value Date  ? NA 138 02/27/2021  ? K 4.2 02/27/2021  ? CO2 25 02/27/2021  ? GLUCOSE 142 (H) 02/27/2021  ? BUN 21 02/27/2021  ? CREATININE 0.87 02/27/2021  ? BILITOT 0.8 04/15/2020  ? ALKPHOS 72 04/15/2020  ? AST 17 04/15/2020  ? ALT 20  04/15/2020  ? PROT 6.7 04/15/2020  ? ALBUMIN 4.2 04/15/2020  ? CALCIUM 9.2 02/27/2021  ? ANIONGAP 9 02/27/2021  ? GFR 97.91 04/15/2020  ? ?Lab  Results  ?Component Value Date  ? HGBA1C 5.5 07/10/2018  ? ? ?  ?Assessment & Plan:  ? ?Problem List Items Addressed This Visit   ? ?  ? Cardiovascular and Mediastinum  ? Hemorrhoids, internal - Primary  ?  rx proctozone 2.5 % rectal ?Handout given for hemmorhoids ?Discussed sitz baths with epsom salts ?  ?  ? Relevant Medications  ? hydrocortisone (PROCTOZONE-HC) 2.5 % rectal cream  ?  ? Respiratory  ? Acute non-recurrent frontal sinusitis  ?  Prescription given for augmentin 875/125 mg po bid for ten days. Pt to continue tylenol/ibuprofen prn sinus pain. Continue with humidifier prn and steam showers recommended as well. instructed If no symptom improvement in 48 hours please f/u ? ?rx for medrol dose pack 4 mg pack ? ?  ?  ? Relevant Medications  ? amoxicillin-clavulanate (AUGMENTIN) 875-125 MG tablet  ? methylPREDNISolone (MEDROL DOSEPAK) 4 MG TBPK tablet  ? Seasonal allergic rhinitis  ?  Suggested pt start daily flonase 50 mcg  ?As well as nightly zyrtec 10 mg  ?  ?  ?  ? Nervous and Auditory  ? Benign paroxysmal positional vertigo due to bilateral vestibular disorder  ?  Rise slowly upon standing.  ?Refill for meclizine 25 mg prn  ?Medrol dose 4 mg for fluid in ear and sinus pressure which I think is contributing. ?  ?  ? Relevant Medications  ? meclizine (ANTIVERT) 25 MG tablet  ? ? ?Meds ordered this encounter  ?Medications  ? DISCONTD: hydrocortisone (PROCTOZONE-HC) 2.5 % rectal cream  ?  Sig: Place 1 application. rectally 2 (two) times daily for 14 days.  ?  Dispense:  30 g  ?  Refill:  1  ?  Order Specific Question:   Supervising Provider  ?  Answer:   BEDSOLE, AMY E [[id number]]  ? DISCONTD: meclizine (ANTIVERT) 25 MG tablet  ?  Sig: Take 1 tablet (25 mg total) by mouth 2 (two) times daily as needed for up to 10 days for dizziness.  ?  Dispense:  20 tablet  ?  Refill:   0  ?  Order Specific Question:   Supervising Provider  ?  Answer:   BEDSOLE, AMY E [[id number]]  ? DISCONTD: methylPREDNISolone (MEDROL DOSEPAK) 4 MG TBPK tablet  ?  Sig: Take per package instructions  ?  Dispen

## 2021-08-19 NOTE — Patient Instructions (Addendum)
Recommend daily flonase (fluticasone, generic) daily as well as daily cetirizine (generic zyrtec) ? ?Start prescription of augmentin 875/125 mg and take as prescribed.  ?Tylenol/ibuprofen ok for sinus pain as needed ?Increase oral fluids. Ok to continue with humidifers and hot steamy showers as discussed during visit. ? ?It was a pleasure seeing you today! Please do not hesitate to reach out with any questions and or concerns. ? ?Regards,  ? ?Rayshawn Visconti ?FNP-C ? ? ? ?

## 2021-08-19 NOTE — Assessment & Plan Note (Signed)
rx proctozone 2.5 % rectal ?Handout given for hemmorhoids ?Discussed sitz baths with epsom salts ?

## 2021-08-19 NOTE — Assessment & Plan Note (Signed)
Rise slowly upon standing.  ?Refill for meclizine 25 mg prn  ?Medrol dose 4 mg for fluid in ear and sinus pressure which I think is contributing. ?

## 2021-08-19 NOTE — Assessment & Plan Note (Signed)
Prescription given for augmentin 875/125 mg po bid for ten days. Pt to continue tylenol/ibuprofen prn sinus pain. Continue with humidifier prn and steam showers recommended as well. instructed If no symptom improvement in 48 hours please f/u ? ?rx for medrol dose pack 4 mg pack ? ?

## 2021-10-26 ENCOUNTER — Encounter: Payer: Self-pay | Admitting: Primary Care

## 2021-10-27 NOTE — Progress Notes (Signed)
Patient never seen °

## 2021-11-29 ENCOUNTER — Ambulatory Visit (HOSPITAL_COMMUNITY)
Admission: EM | Admit: 2021-11-29 | Discharge: 2021-11-29 | Disposition: A | Discharge status: Home / Self Care | Payer: Self-pay | Attending: Internal Medicine | Admitting: Internal Medicine

## 2021-11-29 ENCOUNTER — Encounter (HOSPITAL_COMMUNITY): Payer: Self-pay | Admitting: Physician Assistant

## 2021-11-29 DIAGNOSIS — J029 Acute pharyngitis, unspecified: Secondary | ICD-10-CM

## 2021-11-29 DIAGNOSIS — J40 Bronchitis, not specified as acute or chronic: Secondary | ICD-10-CM

## 2021-11-29 DIAGNOSIS — J329 Chronic sinusitis, unspecified: Secondary | ICD-10-CM

## 2021-11-29 LAB — POCT RAPID STREP A, ED / UC: Streptococcus, Group A Screen (Direct): NEGATIVE

## 2021-11-29 MED ORDER — CEFDINIR 300 MG PO CAPS: 300.0000 mg | ORAL_CAPSULE | Freq: Two times a day (BID) | ORAL | 0 refills | Status: DC

## 2021-11-29 NOTE — Discharge Instructions (Addendum)
Your strep was negative.  We are going to cover for sinus/bronchitis.  Take cefdinir twice daily for 10 days.  I also recommend using Mucinex, Flonase, Tylenol over-the-counter.  Make sure you rest and drink plenty fluid.  If your symptoms or not improving please return for reevaluation.  If anything worsens and you have high fever, chest pain, shortness of breath, nausea/vomiting, difficulty swallowing, change in your voice you need to be seen immediately.

## 2021-11-29 NOTE — ED Provider Notes (Signed)
[facility]    CSN: [id number] Arrival date & time: 11/29/21  0802      History   Chief Complaint Chief Complaint  Patient presents with   Sore Throat   Otalgia    HPI Michael Farley is a 61 y.o. male.   Patient presents today with a several day history of URI symptoms that have significantly worsened in the past 24 hours prompting evaluation.  Reports symptoms were initially mild sore throat, diarrhea, GI upset, otalgia that have progressed to include severe productive cough with associated wheezing.  Denies shortness of breath, chest pain, nausea/vomiting.  Sore throat pain is significant and interfering with ability to eat and drink.  Denies any known sick contacts.  Has tried multiple over-the-counter medications without improvement of symptoms.  He has not had COVID in the past.  He has had COVID-19 vaccine.  Denies history of asthma, COPD, smoking.  Reports antibiotics approximately 2 to 3 months ago.  Denies additional antibiotics or steroids since that time.  He does have seasonal allergies but this is typically only in the spring and does not take daily medication for this.    Past Medical History:  Diagnosis Date   Back pain    Kidney stones    Wolff-Parkinson-White (WPW) syndrome     Patient Active Problem List   Diagnosis Date Noted   Acute non-recurrent frontal sinusitis 08/19/2021   Benign paroxysmal positional vertigo due to bilateral vestibular disorder 08/19/2021   Hemorrhoids, internal 08/19/2021   Seasonal allergic rhinitis 08/19/2021   SVT (supraventricular tachycardia) (HCC) 03/31/2021   Anxiety and depression 04/22/2020   Abnormal ejaculation 04/22/2020   Hyperlipidemia 07/13/2018   Decreased sexual ability 03/31/2016   Preventative health care 03/31/2016   History of kidney stones 03/31/2016   Chronic back pain 03/31/2016    Past Surgical History:  Procedure Laterality Date   KNEE SURGERY Left 1985       Home Medications     Prior to Admission medications   Medication Sig Start Date End Date Taking? Authorizing Provider  cefdinir (OMNICEF) 300 MG capsule Take 1 capsule (300 mg total) by mouth 2 (two) times daily. 11/29/21  Yes Jlon Betker K, PA-C  sildenafil (REVATIO) 20 MG tablet TAKE 2-5 TABLETS BY MOUTH AS NEEDED 30  MINUTES  PRIOR  TO  INTERCOURSE. Office visit required for further refills. Patient not taking: Reported on 11/29/2021 04/01/21   Doreene Nest, NP    Family History Family History  Problem Relation Age of Onset   COPD Mother    Hypertension Mother    Alcohol abuse Father    Diabetes Father    Cancer Maternal Grandmother    Prostate cancer Maternal Uncle    Bladder Cancer Neg Hx    Kidney cancer Neg Hx     Social History Social History   Tobacco Use   Smoking status: Never   Smokeless tobacco: Never  Substance Use Topics   Alcohol use: Yes   Drug use: No     Allergies   Patient has no known allergies.   Review of Systems Review of Systems  Constitutional:  Positive for activity change. Negative for appetite change, fatigue and fever.  HENT:  Positive for congestion, ear pain, sinus pressure and sore throat. Negative for sneezing.   Respiratory:  Positive for cough and wheezing. Negative for chest tightness and shortness of breath.   Cardiovascular:  Negative for chest pain.  Gastrointestinal:  Positive for diarrhea. Negative for abdominal pain,  nausea and vomiting.  Neurological:  Negative for dizziness, light-headedness and headaches.     Physical Exam Triage Vital Signs ED Triage Vitals  Enc Vitals Group     BP 11/29/21 0814 (!) 154/96     Pulse Rate 11/29/21 0814 74     Resp 11/29/21 0814 16     Temp 11/29/21 0814 98.5 F (36.9 C)     Temp Source 11/29/21 0814 Oral     SpO2 11/29/21 0814 95 %     Weight --      Height --      Head Circumference --      Peak Flow --      Pain Score 11/29/21 0816 6     Pain Loc --      Pain Edu? --      Excl. in  GC? --    No data found.  Updated Vital Signs BP (!) 154/96 (BP Location: Right Arm)   Pulse 74   Temp 98.5 F (36.9 C) (Oral)   Resp 16   SpO2 95%   Visual Acuity Right Eye Distance:   Left Eye Distance:   Bilateral Distance:    Right Eye Near:   Left Eye Near:    Bilateral Near:     Physical Exam Vitals reviewed.  Constitutional:      General: He is awake.     Appearance: Normal appearance. He is well-developed. He is not ill-appearing.     Comments: Very pleasant male appears stated age in no acute distress sitting comfortably in exam room  HENT:     Head: Normocephalic and atraumatic.     Right Ear: Tympanic membrane, ear canal and external ear normal. Tympanic membrane is not erythematous or bulging.     Left Ear: Tympanic membrane, ear canal and external ear normal. Tympanic membrane is not erythematous or bulging.     Nose:     Right Sinus: Maxillary sinus tenderness present. No frontal sinus tenderness.     Left Sinus: Maxillary sinus tenderness present. No frontal sinus tenderness.     Mouth/Throat:     Pharynx: Uvula midline. Posterior oropharyngeal erythema present. No oropharyngeal exudate or uvula swelling.     Tonsils: No tonsillar exudate or tonsillar abscesses.     Comments: Significant erythema posterior oropharynx Cardiovascular:     Rate and Rhythm: Normal rate and regular rhythm.     Heart sounds: Normal heart sounds, S1 normal and S2 normal. No murmur heard. Pulmonary:     Effort: Pulmonary effort is normal. No accessory muscle usage or respiratory distress.     Breath sounds: No stridor. Rhonchi present. No wheezing or rales.     Comments: Scattered rhonchi clear with cough Abdominal:     General: Bowel sounds are normal.     Palpations: Abdomen is soft.     Tenderness: There is no abdominal tenderness.  Lymphadenopathy:     Head:     Right side of head: Submandibular adenopathy present. No submental or tonsillar adenopathy.     Left side of  head: Submandibular adenopathy present. No submental or tonsillar adenopathy.     Cervical: No cervical adenopathy.  Neurological:     Mental Status: He is alert.  Psychiatric:        Behavior: Behavior is cooperative.      UC Treatments / Results  Labs (all labs ordered are listed, but only abnormal results are displayed) Labs Reviewed  POCT RAPID STREP A, ED / UC  EKG   Radiology No results found.  Procedures Procedures (including critical care time)  Medications Ordered in UC Medications - No data to display  Initial Impression / Assessment and Plan / UC Course  I have reviewed the triage vital signs and the nursing notes.  Pertinent labs & imaging results that were available during my care of the patient were reviewed by me and considered in my medical decision making (see chart for details).     No indication for viral testing as patient has been symptomatic for over 5 days and this would not change management.  Patient is well-appearing, afebrile, nontoxic, nontachycardic.  Given recent double worsening of symptoms concern for secondary bacterial infection.  Patient was started on Omnicef twice daily for 10 days given recent Augmentin use.  Strep testing was negative in clinic today.  Recommended that he use over-the-counter medications for additional symptom relief including Mucinex, Flonase, Tylenol.  He is to rest and drink plenty of fluid.  Discussed that if symptoms or not improving he needs to return for reevaluation.  Discussed alarm symptoms that warrant emergent evaluation including high fever not responding to medication, chest pain, shortness of breath, nausea/vomiting interfering with oral intake, dysphagia, muffled voice.  Return precautions given to which he expressed understanding.  Work excuse note provided.  Final Clinical Impressions(s) / UC Diagnoses   Final diagnoses:  Sinobronchitis  Pharyngitis, unspecified etiology     Discharge  Instructions      Your strep was negative.  We are going to cover for sinus/bronchitis.  Take cefdinir twice daily for 10 days.  I also recommend using Mucinex, Flonase, Tylenol over-the-counter.  Make sure you rest and drink plenty fluid.  If your symptoms or not improving please return for reevaluation.  If anything worsens and you have high fever, chest pain, shortness of breath, nausea/vomiting, difficulty swallowing, change in your voice you need to be seen immediately.     ED Prescriptions     Medication Sig Dispense Auth. Provider   cefdinir (OMNICEF) 300 MG capsule Take 1 capsule (300 mg total) by mouth 2 (two) times daily. 20 capsule Panfilo Ketchum, Noberto Retort, PA-C      PDMP not reviewed this encounter.   Jeani Hawking, PA-C 11/29/21 0831

## 2021-11-29 NOTE — ED Triage Notes (Signed)
Pt presents with c/o otalgia, sore throat, and diarrhea. Pt taking OTC cough and cold medications for comfort.

## 2021-12-01 ENCOUNTER — Encounter: Payer: Self-pay | Admitting: Primary Care

## 2021-12-01 ENCOUNTER — Ambulatory Visit (INDEPENDENT_AMBULATORY_CARE_PROVIDER_SITE_OTHER): Payer: Self-pay | Admitting: Primary Care

## 2021-12-01 VITALS — BP 124/86 | HR 86 | Temp 97.0°F | Ht 70.0 in | Wt 223.0 lb

## 2021-12-01 DIAGNOSIS — Z0000 Encounter for general adult medical examination without abnormal findings: Secondary | ICD-10-CM

## 2021-12-01 DIAGNOSIS — J069 Acute upper respiratory infection, unspecified: Secondary | ICD-10-CM

## 2021-12-01 DIAGNOSIS — G8929 Other chronic pain: Secondary | ICD-10-CM

## 2021-12-01 DIAGNOSIS — F32A Depression, unspecified: Secondary | ICD-10-CM

## 2021-12-01 DIAGNOSIS — E785 Hyperlipidemia, unspecified: Secondary | ICD-10-CM

## 2021-12-01 DIAGNOSIS — F419 Anxiety disorder, unspecified: Secondary | ICD-10-CM

## 2021-12-01 DIAGNOSIS — I456 Pre-excitation syndrome: Secondary | ICD-10-CM

## 2021-12-01 DIAGNOSIS — M545 Low back pain, unspecified: Secondary | ICD-10-CM

## 2021-12-01 DIAGNOSIS — N5319 Other ejaculatory dysfunction: Secondary | ICD-10-CM

## 2021-12-01 DIAGNOSIS — R6882 Decreased libido: Secondary | ICD-10-CM

## 2021-12-01 DIAGNOSIS — Z23 Encounter for immunization: Secondary | ICD-10-CM

## 2021-12-01 DIAGNOSIS — Z125 Encounter for screening for malignant neoplasm of prostate: Secondary | ICD-10-CM

## 2021-12-01 LAB — COMPREHENSIVE METABOLIC PANEL
ALT: 26 U/L (ref 0–53)
AST: 19 U/L (ref 0–37)
Albumin: 4.6 g/dL (ref 3.5–5.2)
Alkaline Phosphatase: 89 U/L (ref 39–117)
BUN: 17 mg/dL (ref 6–23)
CO2: 32 mEq/L (ref 19–32)
Calcium: 10 mg/dL (ref 8.4–10.5)
Chloride: 98 mEq/L (ref 96–112)
Creatinine, Ser: 1 mg/dL (ref 0.40–1.50)
GFR: 81.05 mL/min (ref >60.00)
Glucose, Bld: 103 mg/dL — ABNORMAL HIGH (ref 70–99)
Potassium: 5 mEq/L (ref 3.5–5.1)
Sodium: 136 mEq/L (ref 135–145)
Total Bilirubin: 0.8 mg/dL (ref 0.2–1.2)
Total Protein: 7.3 g/dL (ref 6.0–8.3)

## 2021-12-01 LAB — CBC WITH DIFFERENTIAL/PLATELET
Basophils Absolute: 0.1 10*3/uL (ref 0.0–0.1)
Basophils Relative: 0.5 % (ref 0.0–3.0)
Eosinophils Absolute: 0.3 10*3/uL (ref 0.0–0.7)
Eosinophils Relative: 2.6 % (ref 0.0–5.0)
HCT: 49.3 % (ref 39.0–52.0)
Hemoglobin: 16.3 g/dL (ref 13.0–17.0)
Lymphocytes Relative: 12.3 % (ref 12.0–46.0)
Lymphs Abs: 1.7 10*3/uL (ref 0.7–4.0)
MCHC: 33 g/dL (ref 30.0–36.0)
MCV: 85.9 fl (ref 78.0–100.0)
Monocytes Absolute: 1 10*3/uL (ref 0.1–1.0)
Monocytes Relative: 7.1 % (ref 3.0–12.0)
Neutro Abs: 10.5 10*3/uL — ABNORMAL HIGH (ref 1.4–7.7)
Neutrophils Relative %: 77.5 % — ABNORMAL HIGH (ref 43.0–77.0)
Platelets: 243 10*3/uL (ref 150.0–400.0)
RBC: 5.74 Mil/uL (ref 4.22–5.81)
RDW: 13.2 % (ref 11.5–15.5)
WBC: 13.5 10*3/uL — ABNORMAL HIGH (ref 4.0–10.5)

## 2021-12-01 LAB — LIPID PANEL
Cholesterol: 270 mg/dL — ABNORMAL HIGH (ref 0–200)
HDL: 41.9 mg/dL (ref >39.00)
Total CHOL/HDL Ratio: 6
Triglycerides: 433 mg/dL — ABNORMAL HIGH (ref 0.0–149.0)

## 2021-12-01 LAB — POC COVID19 BINAXNOW: SARS Coronavirus 2 Ag: NEGATIVE

## 2021-12-01 LAB — PSA: PSA: 2.24 ng/mL (ref 0.10–4.00)

## 2021-12-01 LAB — LDL CHOLESTEROL, DIRECT: Direct LDL: 169 mg/dL

## 2021-12-01 MED ORDER — SILDENAFIL CITRATE 20 MG PO TABS: ORAL_TABLET | ORAL | 0 refills | Status: DC

## 2021-12-01 NOTE — Progress Notes (Signed)
Subjective:    Patient ID: Michael Farley, male    DOB: January 30, 1961, 61 y.o.   MRN: [id number]  Cough Associated symptoms include postnasal drip and a sore throat. Pertinent negatives include no chest pain, headaches, rash, rhinorrhea or shortness of breath. There is no history of environmental allergies.    Michael Farley is a very pleasant 61 y.o. male with a history of SVT, allergic rhinitis, BPPV, abnormal ejaculation, anxiety depression, hyperlipidemia who presents today for complete physical.   He would also like to discuss acute URI symptoms. Evaluated at [facility] urgent Care on 11/29/21 for 2 days symptoms of sore throat, diarrhea, GI upset, ear pain, cough. He was prescribed Omnicef for a 10 day course. Strep test was negative. He was not tested for Covid-19.   Symptom onset four days ago. He's been taking Alka-Seltzer Plus and the prescribed antibiotics, no improvement in symptoms. He continues to notice a cough, mild fatigue, chest tightness. He denies fevers, chills, body aches,    Immunizations: -Tetanus: Over 10 years.  -Influenza: Did not complete last season  -Covid-19: Completed 1 dose -Shingles: Never completed   Diet: Fair diet.  Exercise: No regular exercise. Active.   Eye exam: Completed years ago  Dental exam: Completed years ago   Colonoscopy: Completed 3 years ago per patient. We do not have those records.   PSA: Due  BP Readings from Last 3 Encounters:  12/01/21 124/86  11/29/21 (!) 154/96  08/19/21 (!) 118/58         Review of Systems  Constitutional:  Negative for unexpected weight change.  HENT:  Positive for congestion, postnasal drip and sore throat. Negative for rhinorrhea.   Respiratory:  Positive for cough. Negative for shortness of breath.   Cardiovascular:  Negative for chest pain.  Gastrointestinal:  Negative for constipation and diarrhea.  Genitourinary:  Negative for difficulty urinating.  Musculoskeletal:  Positive for  arthralgias and back pain.  Skin:  Negative for rash.  Allergic/Immunologic: Negative for environmental allergies.  Neurological:  Negative for dizziness and headaches.  Psychiatric/Behavioral:  The patient is not nervous/anxious.          Past Medical History:  Diagnosis Date   Back pain    Kidney stones    Wolff-Parkinson-White (WPW) syndrome     Social History   Socioeconomic History   Marital status: Married    Spouse name: Not on file   Number of children: Not on file   Years of education: Not on file   Highest education level: Not on file  Occupational History   Not on file  Tobacco Use   Smoking status: Never   Smokeless tobacco: Never  Substance and Sexual Activity   Alcohol use: Yes   Drug use: No   Sexual activity: Not on file  Other Topics Concern   Not on file  Social History Narrative   Single.   1 child.   Works as a Psychologist, sport and exercise.    Enjoys spending time with his girlfriend, spending time at the beach.    Social Determinants of Health   Financial Resource Strain: Not on file  Food Insecurity: Not on file  Transportation Needs: Not on file  Physical Activity: Not on file  Stress: Not on file  Social Connections: Not on file  Intimate Partner Violence: Not on file    Past Surgical History:  Procedure Laterality Date   KNEE SURGERY Left 1985    Family History  Problem Relation Age of Onset  COPD Mother    Hypertension Mother    Alcohol abuse Father    Diabetes Father    Cancer Maternal Grandmother    Prostate cancer Maternal Uncle    Bladder Cancer Neg Hx    Kidney cancer Neg Hx     No Known Allergies  Current Outpatient Medications on File Prior to Visit  Medication Sig Dispense Refill   cefdinir (OMNICEF) 300 MG capsule Take 1 capsule (300 mg total) by mouth 2 (two) times daily. 20 capsule 0   No current facility-administered medications on file prior to visit.    BP 124/86   Pulse 86   Temp (!) 97 F (36.1 C) (Oral)    Ht 5\' 10"  (1.778 m)   Wt 223 lb (101.2 kg)   SpO2 95%   BMI 32.00 kg/m  Objective:   Physical Exam HENT:     Right Ear: Tympanic membrane and ear canal normal.     Left Ear: Tympanic membrane and ear canal normal.     Nose: Nose normal.     Right Sinus: No maxillary sinus tenderness or frontal sinus tenderness.     Left Sinus: No maxillary sinus tenderness or frontal sinus tenderness.  Eyes:     Conjunctiva/sclera: Conjunctivae normal.  Neck:     Thyroid: No thyromegaly.     Vascular: No carotid bruit.  Cardiovascular:     Rate and Rhythm: Normal rate and regular rhythm.     Heart sounds: Normal heart sounds.  Pulmonary:     Effort: Pulmonary effort is normal.     Breath sounds: Normal breath sounds. No wheezing or rales.  Abdominal:     General: Bowel sounds are normal.     Palpations: Abdomen is soft.     Tenderness: There is no abdominal tenderness.  Musculoskeletal:        General: Normal range of motion.     Cervical back: Neck supple.  Skin:    General: Skin is warm and dry.  Neurological:     Mental Status: He is alert and oriented to person, place, and time.     Cranial Nerves: No cranial nerve deficit.     Deep Tendon Reflexes: Reflexes are normal and symmetric.  Psychiatric:        Mood and Affect: Mood normal.           Assessment & Plan:   Problem List Items Addressed This Visit       Cardiovascular and Mediastinum   Wolff-Parkinson-White (WPW) syndrome    Diagnosed years ago. Evaluated for this in October 2022.  Recommended he establish with cardiology, he declines. Reviewed ED notes, imaging from October 2022      Relevant Medications   sildenafil (REVATIO) 20 MG tablet     Respiratory   Viral URI with cough    Exam and HPI consistent for viral etiology, especially since there has been no improvement on antibiotics. Lungs clear, he does not appear acutely ill.   He was not tested for Covid-19 at Urgent Care, testing today.  Discussed  conservative treatment OTC. Return precautions provided.       Relevant Orders   CBC with Differential/Platelet     Other   Decreased sexual ability   Relevant Medications   sildenafil (REVATIO) 20 MG tablet   Preventative health care - Primary    Due for tetanus and Shingrix. Will update Tetanus today. He will return for Shingrix. Discussed the importance of a healthy diet and regular exercise  in order for weight loss, and to reduce the risk of further co-morbidity.  Exam as noted. Labs pending.  Follow up in 1 year for repeat physical.       Chronic back pain    Ongoing.   He notifies me that he has a source for finding hydrocodone tablets, takes them PRN. Discussed that I do not recommend this practice.   He declines PT vs orthopedic evaluation      Hyperlipidemia    Discussed the importance of a healthy diet and regular exercise in order for weight loss, and to reduce the risk of further co-morbidity.  Repeat lipid panel pending.      Relevant Medications   sildenafil (REVATIO) 20 MG tablet   Other Relevant Orders   Comprehensive metabolic panel   Lipid panel   Anxiety and depression    Denies concerns today. Continue to monitor.       Abnormal ejaculation    Controlled.  Continue sildenafil 20 mg tablets, he takes 40 mg PRN. Refills provided.       Other Visit Diagnoses     Screening for prostate cancer       Relevant Orders   PSA          Doreene Nest, NP

## 2021-12-01 NOTE — Patient Instructions (Addendum)
Stop by the lab prior to leaving today. I will notify you of your results once received.   Schedule nurse visits to return for the shingles vaccines. The second dose is due 2-6 months after the first.   It was a pleasure to see you today!  Preventive Care 31-62 Years Old, Male Preventive care refers to lifestyle choices and visits with your health care provider that can promote health and wellness. Preventive care visits are also called wellness exams. What can I expect for my preventive care visit? Counseling During your preventive care visit, your health care provider may ask about your: Medical history, including: Past medical problems. Family medical history. Current health, including: Emotional well-being. Home life and relationship well-being. Sexual activity. Lifestyle, including: Alcohol, nicotine or tobacco, and drug use. Access to firearms. Diet, exercise, and sleep habits. Safety issues such as seatbelt and bike helmet use. Sunscreen use. Work and work Astronomer. Physical exam Your health care provider will check your: Height and weight. These may be used to calculate your BMI (body mass index). BMI is a measurement that tells if you are at a healthy weight. Waist circumference. This measures the distance around your waistline. This measurement also tells if you are at a healthy weight and may help predict your risk of certain diseases, such as type 2 diabetes and high blood pressure. Heart rate and blood pressure. Body temperature. Skin for abnormal spots. What immunizations do I need?  Vaccines are usually given at various ages, according to a schedule. Your health care provider will recommend vaccines for you based on your age, medical history, and lifestyle or other factors, such as travel or where you work. What tests do I need? Screening Your health care provider may recommend screening tests for certain conditions. This may include: Lipid and cholesterol  levels. Diabetes screening. This is done by checking your blood sugar (glucose) after you have not eaten for a while (fasting). Hepatitis B test. Hepatitis C test. HIV (human immunodeficiency virus) test. STI (sexually transmitted infection) testing, if you are at risk. Lung cancer screening. Prostate cancer screening. Colorectal cancer screening. Talk with your health care provider about your test results, treatment options, and if necessary, the need for more tests. Follow these instructions at home: Eating and drinking  Eat a diet that includes fresh fruits and vegetables, whole grains, lean protein, and low-fat dairy products. Take vitamin and mineral supplements as recommended by your health care provider. Do not drink alcohol if your health care provider tells you not to drink. If you drink alcohol: Limit how much you have to 0-2 drinks a day. Know how much alcohol is in your drink. In the [place] one drink equals one 12 oz bottle of beer (355 mL), one 5 oz glass of wine (148 mL), or one 1 oz glass of hard liquor (44 mL). Lifestyle Brush your teeth every morning and night with fluoride toothpaste. Floss one time each day. Exercise for at least 30 minutes 5 or more days each week. Do not use any products that contain nicotine or tobacco. These products include cigarettes, chewing tobacco, and vaping devices, such as e-cigarettes. If you need help quitting, ask your health care provider. Do not use drugs. If you are sexually active, practice safe sex. Use a condom or other form of protection to prevent STIs. Take aspirin only as told by your health care provider. Make sure that you understand how much to take and what form to take. Work with your health care  provider to find out whether it is safe and beneficial for you to take aspirin daily. Find healthy ways to manage stress, such as: Meditation, yoga, or listening to music. Journaling. Talking to a trusted person. Spending time  with friends and family. Minimize exposure to UV radiation to reduce your risk of skin cancer. Safety Always wear your seat belt while driving or riding in a vehicle. Do not drive: If you have been drinking alcohol. Do not ride with someone who has been drinking. When you are tired or distracted. While texting. If you have been using any mind-altering substances or drugs. Wear a helmet and other protective equipment during sports activities. If you have firearms in your house, make sure you follow all gun safety procedures. What's next? Go to your health care provider once a year for an annual wellness visit. Ask your health care provider how often you should have your eyes and teeth checked. Stay up to date on all vaccines. This information is not intended to replace advice given to you by your health care provider. Make sure you discuss any questions you have with your health care provider. Document Revised: 11/04/2020 Document Reviewed: 11/04/2020 Elsevier Patient Education  2023 [employer].

## 2021-12-01 NOTE — Assessment & Plan Note (Signed)
Denies concerns today. °Continue to monitor. °

## 2021-12-01 NOTE — Addendum Note (Signed)
Addended by: Donnamarie Poag on: 12/01/2021 08:05 AM   Modules accepted: Orders

## 2021-12-01 NOTE — Assessment & Plan Note (Signed)
Diagnosed years ago. Evaluated for this in October 2022.  Recommended he establish with cardiology, he declines. Reviewed ED notes, imaging from October 2022

## 2021-12-01 NOTE — Assessment & Plan Note (Signed)
Due for tetanus and Shingrix. Will update Tetanus today. He will return for Shingrix. Discussed the importance of a healthy diet and regular exercise in order for weight loss, and to reduce the risk of further co-morbidity.  Exam as noted. Labs pending.  Follow up in 1 year for repeat physical.

## 2021-12-01 NOTE — Assessment & Plan Note (Signed)
Discussed the importance of a healthy diet and regular exercise in order for weight loss, and to reduce the risk of further co-morbidity.  Repeat lipid panel pending. 

## 2021-12-01 NOTE — Assessment & Plan Note (Signed)
Controlled.  Continue sildenafil 20 mg tablets, he takes 40 mg PRN. Refills provided.

## 2021-12-01 NOTE — Assessment & Plan Note (Signed)
Ongoing.   He notifies me that he has a source for finding hydrocodone tablets, takes them PRN. Discussed that I do not recommend this practice.   He declines PT vs orthopedic evaluation

## 2021-12-01 NOTE — Assessment & Plan Note (Signed)
Exam and HPI consistent for viral etiology, especially since there has been no improvement on antibiotics. Lungs clear, he does not appear acutely ill.   He was not tested for Covid-19 at Urgent Care, testing today.  Discussed conservative treatment OTC. Return precautions provided.

## 2021-12-03 ENCOUNTER — Telehealth: Payer: Self-pay

## 2021-12-03 ENCOUNTER — Other Ambulatory Visit: Payer: Self-pay | Admitting: Primary Care

## 2021-12-03 DIAGNOSIS — R6882 Decreased libido: Secondary | ICD-10-CM

## 2021-12-03 DIAGNOSIS — D72829 Elevated white blood cell count, unspecified: Secondary | ICD-10-CM

## 2021-12-03 DIAGNOSIS — E785 Hyperlipidemia, unspecified: Secondary | ICD-10-CM

## 2021-12-03 MED ORDER — SILDENAFIL CITRATE 20 MG PO TABS: ORAL_TABLET | ORAL | 0 refills | Status: DC

## 2021-12-03 NOTE — Addendum Note (Signed)
Addended by: Doreene Nest on: 12/03/2021 04:53 PM   Modules accepted: Orders

## 2021-12-03 NOTE — Telephone Encounter (Signed)
Patient called in stating that he was prescribed sildenafil (REVATIO) 20 MG tablet. Was way to expensive at [facility], wanted to know if we can send it to [facility]  - [address] [place], [place], [place] [address].

## 2021-12-03 NOTE — Telephone Encounter (Signed)
Noted.  Prescription resent to [facility] per patient request.

## 2021-12-10 ENCOUNTER — Encounter: Payer: Self-pay | Admitting: Internal Medicine

## 2021-12-10 ENCOUNTER — Ambulatory Visit (INDEPENDENT_AMBULATORY_CARE_PROVIDER_SITE_OTHER): Payer: Self-pay | Admitting: Internal Medicine

## 2021-12-10 DIAGNOSIS — J0101 Acute recurrent maxillary sinusitis: Secondary | ICD-10-CM | POA: Insufficient documentation

## 2021-12-10 HISTORY — DX: Acute recurrent maxillary sinusitis: J01.01

## 2021-12-10 MED ORDER — AMOXICILLIN-POT CLAVULANATE 875-125 MG PO TABS: 1.0000 | ORAL_TABLET | Freq: Two times a day (BID) | ORAL | 1 refills | Status: DC

## 2021-12-10 NOTE — Progress Notes (Signed)
Subjective:    Patient ID: Michael Farley, male    DOB: 12/26/1960, 61 y.o.   MRN: [id number]  HPI Here due to persistent respiratory symptoms  Started with spring--after allergies Went into his chest Initial visit 3/30--antibiotics helped some  Then seen 10 days ago---cefdinir didn't work too well Still "in my ears"---can feel fluid moving Cough and sore throat still---all in sinuses. Might be partially related to snoring No fever Some SOB and tightness in chest----lots of thick green mucus (out of sinuses also)  Notes apparent small nasal passages Has sinus congestion Hasn't seen ENT Hasn't been taking any allergy medications  Current Outpatient Medications on File Prior to Visit  Medication Sig Dispense Refill   sildenafil (REVATIO) 20 MG tablet TAKE 2-5 TABLETS BY MOUTH AS NEEDED 30  MINUTES  PRIOR  TO  INTERCOURSE. Office visit required for further refills. 50 tablet 0   No current facility-administered medications on file prior to visit.    No Known Allergies  Past Medical History:  Diagnosis Date   Back pain    Kidney stones    Wolff-Parkinson-White (WPW) syndrome     Past Surgical History:  Procedure Laterality Date   KNEE SURGERY Left 1985    Family History  Problem Relation Age of Onset   COPD Mother    Hypertension Mother    Alcohol abuse Father    Diabetes Father    Cancer Maternal Grandmother    Prostate cancer Maternal Uncle    Bladder Cancer Neg Hx    Kidney cancer Neg Hx     Social History   Socioeconomic History   Marital status: Married    Spouse name: Not on file   Number of children: Not on file   Years of education: Not on file   Highest education level: Not on file  Occupational History   Not on file  Tobacco Use   Smoking status: Former    Types: Cigarettes    Quit date: 59    Years since quitting: 40.5    Passive exposure: Never   Smokeless tobacco: Never  Substance and Sexual Activity   Alcohol use: Yes   Drug  use: No   Sexual activity: Not on file  Other Topics Concern   Not on file  Social History Narrative   Single.   1 child.   Works as a Psychologist, sport and exercise.    Enjoys spending time with his girlfriend, spending time at the beach.    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 Has missed some work--but back this week     Objective:   Physical Exam Constitutional:      Appearance: Normal appearance.  HENT:     Head:     Comments: Mild maxillary tenderness    Right Ear: Tympanic membrane and ear canal normal.     Left Ear: Tympanic membrane and ear canal normal.     Nose:     Comments: Moderate inflammation    Mouth/Throat:     Pharynx: No oropharyngeal exudate or posterior oropharyngeal erythema.  Pulmonary:     Effort: Pulmonary effort is normal.     Breath sounds: Normal breath sounds. No wheezing  or rales.  Musculoskeletal:     Cervical back: Neck supple.  Lymphadenopathy:     Cervical: No cervical adenopathy.  Neurological:     Mental Status: He is alert.            Assessment & Plan:

## 2021-12-10 NOTE — Patient Instructions (Signed)
Please start fluticasone nasal spray ---2 sprays in each nostril daily. When it is allergy season (or you are sick), I recommend cetirizine 10mg  daily If you keep having recurrences, we will set you up with an ENT doctor.

## 2021-12-10 NOTE — Assessment & Plan Note (Signed)
Clearly has sinus/allergy issues that are undertreated Will have him start fluticasone daily Cetirizine for allergy season augmentin 875 bid x 10 days (with refill) ENT eval if persists

## 2022-02-09 ENCOUNTER — Other Ambulatory Visit: Payer: Self-pay

## 2022-05-30 ENCOUNTER — Emergency Department (HOSPITAL_BASED_OUTPATIENT_CLINIC_OR_DEPARTMENT_OTHER)
Admission: EM | Admit: 2022-05-30 | Discharge: 2022-05-31 | Disposition: A | Discharge status: Home / Self Care | Payer: Medicaid Other | Attending: Emergency Medicine | Admitting: Emergency Medicine

## 2022-05-30 ENCOUNTER — Encounter (HOSPITAL_BASED_OUTPATIENT_CLINIC_OR_DEPARTMENT_OTHER): Payer: Self-pay

## 2022-05-30 ENCOUNTER — Emergency Department (HOSPITAL_BASED_OUTPATIENT_CLINIC_OR_DEPARTMENT_OTHER): Payer: Medicaid Other | Admitting: Radiology

## 2022-05-30 DIAGNOSIS — R079 Chest pain, unspecified: Secondary | ICD-10-CM | POA: Diagnosis present

## 2022-05-30 DIAGNOSIS — I471 Supraventricular tachycardia, unspecified: Secondary | ICD-10-CM | POA: Insufficient documentation

## 2022-05-30 DIAGNOSIS — Z87891 Personal history of nicotine dependence: Secondary | ICD-10-CM | POA: Diagnosis not present

## 2022-05-30 LAB — CBC
HCT: 51.2 % (ref 39.0–52.0)
Hemoglobin: 17.7 g/dL — ABNORMAL HIGH (ref 13.0–17.0)
MCH: 29 pg (ref 26.0–34.0)
MCHC: 34.6 g/dL (ref 30.0–36.0)
MCV: 83.8 fL (ref 80.0–100.0)
Platelets: 297 10*3/uL (ref 150–400)
RBC: 6.11 MIL/uL — ABNORMAL HIGH (ref 4.22–5.81)
RDW: 13.1 % (ref 11.5–15.5)
WBC: 16.2 10*3/uL — ABNORMAL HIGH (ref 4.0–10.5)
nRBC: 0 % (ref 0.0–0.2)

## 2022-05-30 LAB — BASIC METABOLIC PANEL
Anion gap: 17 — ABNORMAL HIGH (ref 5–15)
BUN: 24 mg/dL — ABNORMAL HIGH (ref 8–23)
CO2: 19 mmol/L — ABNORMAL LOW (ref 22–32)
Calcium: 9.9 mg/dL (ref 8.9–10.3)
Chloride: 101 mmol/L (ref 98–111)
Creatinine, Ser: 1.03 mg/dL (ref 0.61–1.24)
GFR, Estimated: >60 mL/min (ref >60)
Glucose, Bld: 153 mg/dL — ABNORMAL HIGH (ref 70–99)
Potassium: 4.1 mmol/L (ref 3.5–5.1)
Sodium: 137 mmol/L (ref 135–145)

## 2022-05-30 LAB — TROPONIN I (HIGH SENSITIVITY): Troponin I (High Sensitivity): 4 ng/L (ref <18)

## 2022-05-30 MED ORDER — ADENOSINE 6 MG/2ML IV SOLN: 6.0000 mg | Freq: Once | INTRAVENOUS | Status: DC

## 2022-05-30 NOTE — ED Triage Notes (Signed)
Patient presents from home, aaxo4, ambulatory. States he has neck and jaw pain coupled with slight SOB and tachycardia. States he has hx of WPW, and extensive cardiac hx. Presents for possible MI s/s

## 2022-05-30 NOTE — ED Provider Notes (Signed)
  [facility] EMERGENCY DEPT Provider Note   CSN: [id number] Arrival date & time: 05/30/22  2212     History {Add pertinent medical, surgical, social history, OB history to HPI:1} Chief Complaint  Patient presents with   Chest Pain    Michael Farley is a 62 y.o. male.  HPI     Home Medications Prior to Admission medications   Medication Sig Start Date End Date Taking? Authorizing Provider  amoxicillin-clavulanate (AUGMENTIN) 875-125 MG tablet Take 1 tablet by mouth 2 (two) times daily. 12/10/21   Venia Carbon, MD  sildenafil (REVATIO) 20 MG tablet TAKE 2-5 TABLETS BY MOUTH AS NEEDED 30  MINUTES  PRIOR  TO  INTERCOURSE. Office visit required for further refills. 12/03/21   Pleas Koch, NP      Allergies    Patient has no known allergies.    Review of Systems   Review of Systems  Physical Exam Updated Vital Signs BP (!) 152/130 (BP Location: Right Arm)   Pulse (!) 198   Temp 97.9 F (36.6 C) (Oral)   Resp 18   Ht 1.778 m (5\' 10" )   Wt 93 kg   SpO2 98%   BMI 29.41 kg/m  Physical Exam  ED Results / Procedures / Treatments   Labs (all labs ordered are listed, but only abnormal results are displayed) Labs Reviewed  CBC - Abnormal; Notable for the following components:      Result Value   WBC 16.2 (*)    RBC 6.11 (*)    Hemoglobin 17.7 (*)    All other components within normal limits  BASIC METABOLIC PANEL  TROPONIN I (HIGH SENSITIVITY)    EKG None  Radiology No results found.  Procedures Procedures  {Document cardiac monitor, telemetry assessment procedure when appropriate:1}  Medications Ordered in ED Medications - No data to display  ED Course/ Medical Decision Making/ A&P                           Medical Decision Making Amount and/or Complexity of Data Reviewed Labs: ordered. Radiology: ordered.   ***  {Document critical care time when appropriate:1} {Document review of labs and clinical decision tools ie  heart score, Chads2Vasc2 etc:1}  {Document your independent review of radiology images, and any outside records:1} {Document your discussion with family members, caretakers, and with consultants:1} {Document social determinants of health affecting pt's care:1} {Document your decision making why or why not admission, treatments were needed:1} Final Clinical Impression(s) / ED Diagnoses Final diagnoses:  None    Rx / DC Orders ED Discharge Orders     None

## 2022-05-31 ENCOUNTER — Telehealth: Payer: Self-pay

## 2022-05-31 NOTE — Telephone Encounter (Signed)
Transition Care Management Follow-up Telephone Call Date of discharge and from where: [facility] ED 05/31/2022 How have you been since you were released from the hospital? tired Any questions or concerns? No  Items Reviewed: Did the pt receive and understand the discharge instructions provided? Yes  Medications obtained and verified? Yes  Other? No  Any new allergies since your discharge? No  Dietary orders reviewed? Yes Do you have support at home? Yes   Home Care and Equipment/Supplies: Were home health services ordered? no If so, what is the name of the agency? N/a  Has the agency set up a time to come to the patient's home? not applicable Were any new equipment or medical supplies ordered?  No What is the name of the medical supply agency? N/a Were you able to get the supplies/equipment? no Do you have any questions related to the use of the equipment or supplies? No  Functional Questionnaire: (I = Independent and D = Dependent) ADLs: I  Bathing/Dressing- I  Meal Prep- I  Eating- I  Maintaining continence- I  Transferring/Ambulation- I  Managing Meds- I  Follow up appointments reviewed:  PCP Hospital f/u appt confirmed? Yes  Scheduled to see Alma Friendly on 06/08/2022 @ 10:20. [facility] f/u appt confirmed? No   Are transportation arrangements needed? No  If their condition worsens, is the pt aware to call PCP or go to the Emergency Dept.? Yes Was the patient provided with contact information for the PCP's office or ED? Yes Was to pt encouraged to call back with questions or concerns? Yes Juanda Crumble, LPN [facility] Dial [phone]

## 2022-05-31 NOTE — Discharge Instructions (Signed)
Turn for any new or worse symptoms.  Try the vasovagal maneuvers at home if your heart rate gets fast again.  Make an appointment to follow-up with cardiology.  Today's workup without any significant abnormalities.

## 2022-06-01 ENCOUNTER — Other Ambulatory Visit (HOSPITAL_BASED_OUTPATIENT_CLINIC_OR_DEPARTMENT_OTHER): Payer: Self-pay

## 2022-06-01 ENCOUNTER — Other Ambulatory Visit: Payer: Self-pay

## 2022-06-01 ENCOUNTER — Emergency Department (HOSPITAL_BASED_OUTPATIENT_CLINIC_OR_DEPARTMENT_OTHER)
Admission: EM | Admit: 2022-06-01 | Discharge: 2022-06-01 | Disposition: A | Discharge status: Home / Self Care | Payer: Medicaid Other | Attending: Emergency Medicine | Admitting: Emergency Medicine

## 2022-06-01 ENCOUNTER — Emergency Department (HOSPITAL_BASED_OUTPATIENT_CLINIC_OR_DEPARTMENT_OTHER): Payer: Medicaid Other

## 2022-06-01 DIAGNOSIS — I471 Supraventricular tachycardia, unspecified: Secondary | ICD-10-CM | POA: Diagnosis not present

## 2022-06-01 DIAGNOSIS — R002 Palpitations: Secondary | ICD-10-CM | POA: Diagnosis present

## 2022-06-01 MED ORDER — METOPROLOL TARTRATE 25 MG PO TABS
25.0000 mg | ORAL_TABLET | Freq: Two times a day (BID) | ORAL | 0 refills | Status: DC
  Filled 2022-06-01: qty 60, 30d supply, fill #0

## 2022-06-01 MED ORDER — METOPROLOL TARTRATE 25 MG PO TABS
25.0000 mg | ORAL_TABLET | Freq: Once | ORAL | Status: AC
  Administered 2022-06-01: 25 mg via ORAL
  Filled 2022-06-01: qty 1

## 2022-06-01 NOTE — Discharge Planning (Signed)
  [place] Medication Assistance Card Name: Rayshard Schirtzinger  ID (MRN): [id number]  [place]: [id number] RX Group: [id number] Discharge Date: 06/01/2022 Expiration Date:06/09/2022                                           (must be filled within 7 days of discharge)     You have been approved to have the prescriptions written by your discharging physician filled through our [facility] (Medication Assistance Through [facility]) program. This program allows for a one-time (no refills) 34-day supply of selected medications for a low copay amount.  The copay is $3.00 per prescription. For instance, if you have one prescription, you will pay $3.00; for two prescriptions, you pay $6.00; for three prescriptions, you pay $9.00; and so on.  Only certain pharmacies are participating in this program with [facility] You will need to select one of the pharmacies from the attached list and take your prescriptions, this letter, and your photo ID to one of the [place] pharmacies, [facility], [facility] [address], or [facility] [address].   We are excited that you are able to use the [facility] program to get your medications. These prescriptions must be filled within 7 days of hospital discharge or they will no longer be valid for the [facility] program. Should you have any problems with your prescriptions please contact your case management team member at [phone] for [place] [facility]/ [place] you, [place] Management

## 2022-06-01 NOTE — Discharge Planning (Signed)
RNCM consulted regarding PCP establishment and insurance enrollment. Pt presented to [facility] ED today with Tachycardia. EDP met with pt at bedside; pt confirms not having access to follow up care with PCP or insurance coverage. RNCM obtained appointment on (06/09/2022), time (0845) with Dr Serita Butcher and placed on After Visit Summary paperwork.  No further case management needs communicated at this time. Caprisha Bridgett J. Clydene Laming, [place], [place], [place]

## 2022-06-01 NOTE — ED Provider Notes (Signed)
[facility] EMERGENCY DEPT Provider Note   CSN: [id number] Arrival date & time: 06/01/22  1351     History  Chief Complaint  Patient presents with   Tachycardia    Michael Farley is a 62 y.o. male.  Patient here with palpitations.  Had an episode about 15 minutes ago that, resolved now.  Has history of SVT and may be Wolff-Parkinson-White.  Is not on any medication for this.  He was recommended for ablation in the past he states.  He was seen 2 days ago for the same had treatment and was discharged.  He has chest discomfort when he has these episodes.  He says he had several episodes this last month maybe 8 or 9 where he had palpitations.  Some have resolved on their own.  Some have resolved with his Ativan.  Denies any shortness of breath, abdominal pain, nausea, vomiting.  He is fairly concerned that he needs admission for ablation and treatment.  The history is provided by the patient.       Home Medications Prior to Admission medications   Medication Sig Start Date End Date Taking? Authorizing Provider  metoprolol tartrate (LOPRESSOR) 25 MG tablet Take 1 tablet (25 mg total) by mouth 2 (two) times daily. 06/01/22 07/01/22 Yes Jayleena Stille, DO  amoxicillin-clavulanate (AUGMENTIN) 875-125 MG tablet Take 1 tablet by mouth 2 (two) times daily. 12/10/21   Karie Schwalbe, MD  sildenafil (REVATIO) 20 MG tablet TAKE 2-5 TABLETS BY MOUTH AS NEEDED 30  MINUTES  PRIOR  TO  INTERCOURSE. Office visit required for further refills. 12/03/21   Doreene Nest, NP      Allergies    Patient has no known allergies.    Review of Systems   Review of Systems  Physical Exam Updated Vital Signs  ED Triage Vitals [06/01/22 1401]  Enc Vitals Group     BP (!) 159/105     Pulse Rate (!) 105     Resp (!) 24     Temp 97.6 F (36.4 C)     Temp Source Temporal     SpO2 100 %     Weight 205 lb 0.4 oz (93 kg)     Height 5\' 10"  (1.778 m)     Head Circumference      Peak Flow       Pain Score      Pain Loc      Pain Edu?      Excl. in GC?     Physical Exam Vitals and nursing note reviewed.  Constitutional:      General: He is not in acute distress.    Appearance: He is well-developed. He is not ill-appearing.  HENT:     Head: Normocephalic and atraumatic.     Nose: Nose normal.     Mouth/Throat:     Mouth: Mucous membranes are moist.  Eyes:     Extraocular Movements: Extraocular movements intact.     Conjunctiva/sclera: Conjunctivae normal.     Pupils: Pupils are equal, round, and reactive to light.  Cardiovascular:     Rate and Rhythm: Normal rate and regular rhythm.     Pulses: Normal pulses.     Heart sounds: Normal heart sounds. No murmur heard. Pulmonary:     Effort: Pulmonary effort is normal. No respiratory distress.     Breath sounds: Normal breath sounds.  Abdominal:     Palpations: Abdomen is soft.     Tenderness: There is no abdominal  tenderness.  Musculoskeletal:        General: No swelling.     Cervical back: Normal range of motion and neck supple.  Skin:    General: Skin is warm and dry.     Capillary Refill: Capillary refill takes less than 2 seconds.  Neurological:     General: No focal deficit present.     Mental Status: He is alert.  Psychiatric:        Mood and Affect: Mood normal.     ED Results / Procedures / Treatments   Labs (all labs ordered are listed, but only abnormal results are displayed) Labs Reviewed - No data to display  EKG EKG Interpretation  Date/Time:  Wednesday June 01 2022 14:04:21 EST Ventricular Rate:  97 PR Interval:  140 QRS Duration: 84 QT Interval:  354 QTC Calculation: 449 R Axis:   -27 Text Interpretation: Normal sinus rhythm Minimal voltage criteria for LVH, may be normal variant ( R in aVL ) Nonspecific ST abnormality Abnormal ECG When compared with ECG of 30-May-2022 22:37, PREVIOUS ECG IS PRESENT Confirmed by Lennice Sites ([id number]) on 06/01/2022 2:06:30 PM  Radiology DG Chest  Port 1 View  Result Date: 05/30/2022 CLINICAL DATA:  Chest pain EXAM: PORTABLE CHEST 1 VIEW COMPARISON:  02/27/2021 FINDINGS: Lungs are well expanded, symmetric, and clear. No pneumothorax or pleural effusion. Cardiac size within normal limits. Pulmonary vascularity is normal. Osseous structures are age-appropriate. No acute bone abnormality. IMPRESSION: No active disease. Electronically Signed   By: Fidela Salisbury M.D.   On: 05/30/2022 22:50    Procedures Procedures    Medications Ordered in ED Medications  metoprolol tartrate (LOPRESSOR) tablet 25 mg (25 mg Oral Given 06/01/22 1502)    ED Course/ Medical Decision Making/ A&P                           Medical Decision Making Risk Prescription drug management.   Jassen Sarver is here with palpitations.  History of SVT/may be Wolff-Parkinson-White.  Episode 2 days ago SVT per my review of chart.  He thought he had another episode prior to arrival here today.  He also struggles with anxiety.  He is not sure if he had a panic attack or SVT episode.  His EKG here shows sinus rhythm.  No ischemic changes.  Heart rates in the 90s.  He appears fairly anxious.  He takes Xanax as needed.  Overall his blood work 2 days ago were unremarkable.  I do not think he needs any workup further today.  I talked with Dr. Burt Knack with cardiology and we will start the patient on Lopressor 25 mg twice daily.  They will arrange for close follow-up.  He was recommended for EP study and ablation in the past which sounds like he might need.  I was able to arrange for him to have an appointment with the financial counselor to help assist with medical bills as he does not have insurance and is self-employed and is struggling with money.  I have also arrange for him to get his medications for very low cost after talking with case manager as well.  Recommend that he talk about his anxiety with his primary care doctor.  Patient discharged in good condition.  Understands return  precautions.  This chart was dictated using voice recognition software.  Despite best efforts to proofread,  errors can occur which can change the documentation meaning.  Final Clinical Impression(s) / ED Diagnoses Final diagnoses:  Palpitations  SVT (supraventricular tachycardia)    Rx / DC Orders ED Discharge Orders          Ordered    metoprolol tartrate (LOPRESSOR) 25 MG tablet  2 times daily        06/01/22 1438              Shaylin Blatt, Quita Skye, DO 06/01/22 1505

## 2022-06-01 NOTE — Discharge Instructions (Addendum)
Take medication as prescribed.  Take your next dose tomorrow morning.  Cardiology should be calling you for a follow-up appointment but I encourage you to call the office to confirm this appointment.  Follow-up with your primary care doctor as scheduled to talk about your anxiety symptoms.  I think he would benefit may be from being on a long-term anxiety medicine that is not a benzodiazepine.  Follow-up with financial aid counselor. On 1/18 at 845 am

## 2022-06-01 NOTE — ED Triage Notes (Signed)
Patient here POV from Home.  Endorses Tachycardia, Left Sided CP and Left Arm that began 15 Minutes ago. History of Same recently.   Uncomfortable during Triage. A&Ox4. GCS 15. BIB Wheelchair.

## 2022-06-03 ENCOUNTER — Telehealth: Payer: Self-pay

## 2022-06-03 NOTE — Telephone Encounter (Signed)
Transition Care Management Follow-up Telephone Call Date of discharge and from where: 06/01/2022 [facility] How have you been since you were released from the hospital? tired Any questions or concerns? Yes, finding there are many triggers that sets off the SVTs, stress some medication, wants to talk about all the possible triggers and get this under control  Items Reviewed: Did the pt receive and understand the discharge instructions provided? Yes  Medications obtained and verified? Yes  Other? No  Any new allergies since your discharge? No  Dietary orders reviewed? No Do you have support at home?   Home Care and Equipment/Supplies: Were home health services ordered? not applicable If so, what is the name of the agency? N/a  Has the agency set up a time to come to the patient's home? not applicable Were any new equipment or medical supplies ordered?  No What is the name of the medical supply agency? N/a Were you able to get the supplies/equipment? not applicable Do you have any questions related to the use of the equipment or supplies? No  Functional Questionnaire: (I = Independent and D = Dependent) ADLs: I  Bathing/Dressing- I  Meal Prep- I  Eating- I  Maintaining continence- I  Transferring/Ambulation- I  Managing Meds- I  Follow up appointments reviewed:  PCP Hospital f/u appt confirmed? Yes  Scheduled to see Alma Friendly NP on 06/08/2022 @ 10:20. Are transportation arrangements needed? No  If their condition worsens, is the pt aware to call PCP or go to the Emergency Dept.? Yes Was the patient provided with contact information for the PCP's office or ED? Yes Was to pt encouraged to call back with questions or concerns? Yes

## 2022-06-07 NOTE — Progress Notes (Incomplete)
CC: ***  HPI:   [name] is a 62 y.o. male with a past medical history of chronic back pain, kidney stones, anxiety, and WPW syndrome who presents to establish care. He visited in the ED on 1-10 for palpitations, workup was negative for ischemia, and he was discharged with metoprolol. Evaluated on 1-17 by Alma Friendly NP who discontinued xxxx and prescribed sertraline for anxiety.   Michael Farley is here with palpitations.  History of SVT/may be Wolff-Parkinson-White.  Episode 2 days ago SVT per my review of chart.  He thought he had another episode prior to arrival here today.  He also struggles with anxiety.  He is not sure if he had a panic attack or SVT episode.  His EKG here shows sinus rhythm.  No ischemic changes.  Heart rates in the 90s.  He appears fairly anxious.  He takes Xanax as needed.  Overall his blood work 2 days ago were unremarkable.  I do not think he needs any workup further today.  I talked with Dr. Burt Knack with cardiology and we will start the patient on Lopressor 25 mg twice daily.  They will arrange for close follow-up.  He was recommended for EP study and ablation in the past which sounds like he might need.  I was able to arrange for him to have an appointment with the financial counselor to help assist with medical bills as he does not have insurance and is self-employed and is struggling with money.  I have also arrange for him to get his medications for very low cost after talking with case manager as well.  Recommend that he talk about his anxiety with his primary care doctor.  Patient discharged in good condition.  Understands return precautions.    1-17 IM Visit...  Wolff-Parkinson-White (WPW) syndrome Assessment & Plan: Reviewed hospital notes, labs, imaging from both ED visits. Deteriorated, anxiety and diet are causative variables. He agrees to anxiety treatment, see other notes. Discussed to avoid caffeine.  Follow up with cardiology as  scheduled.  Remain off metoprolol tartrate.   [name]      Anxiety and depression Assessment & Plan: Deteriorated.  Discussed options for treatment including therapy and medication.  He kindly declines therapy, but plans on finding a faith based therapist.  Start Zoloft 50 mg daily. Patient is to take 1/2 tablet daily for 8 days, then advance to 1 full tablet thereafter. We discussed possible side effects of headache, GI upset, drowsiness, and SI/HI. If thoughts of SI/HI develop, we discussed to present to the emergency immediately. Patient verbalized understanding.  Follow up in 6 weeks for re-evaluation.   [name]    Past Medical History:  Diagnosis Date   Back pain    Kidney stones    Wolff-Parkinson-White (WPW) syndrome      Review of Systems:    Reports *** Denies *** (subjective fever?, pain anywhere?, bowel changes?)   Physical Exam:  There were no vitals filed for this visit.  General:   awake and alert, sitting comfortably in chair, cooperative, not in acute distress Skin:   warm and dry, intact without any obvious lesions or scars, no rashes or lesions  Head:   normocephalic and atraumatic, oral mucosa moist with good dentition, no lymphadenopathy Eyes:   extraocular movements intact, conjunctivae pink, pupils round and reactive to light, no periorbital swelling or scleral icterus Ears:   pinnae normal, no discharge or external lesions  Nose:   symmetrical and without mucosal inflammation, no  external lesions or discharge Lungs:   normal respiratory effort, breathing unlabored, symmetrical chest rise, no crackles or wheezing Cardiac:   regular rate and rhythm, normal S1 and S2, capillary refill 2-3 seconds, dorsalis pedis pulses intact bilaterally, no pitting edema Abdomen:   soft and non-distended, normoactive bowel sounds present in all four quadrants, no guarding or palpable masses Musculoskeletal:   full range of motion in joints, motor strength 5 /5 in all  four extremities, no obvious deformities or joint tenderness Neurologic:   oriented to person-place-time, moving all extremities, sensation to light touch intact, no facial droop Psychiatric:   euthymic mood with congruent affect, intelligible speech    Assessment & Plan:   No problem-specific Assessment & Plan notes found for this encounter.     See Encounters Tab for problem based charting.  Patient {GC/GE:3044014::"discussed with","seen with"} Dr. {NAMES:[id number]::"Guilloud","Hoffman","Mullen","Narendra","Williams","Vincent"}

## 2022-06-07 NOTE — Progress Notes (Signed)
Cardiology Office Note Date:  06/10/2022  Patient ID:  Michael Farley, DOB 1960-07-05, MRN [id number] PCP:  Pleas Koch, NP  Electrophysiologist: Dr. Lovena Le    Chief Complaint:  discuss ablation  History of Present Illness: Michael Farley is a 62 y.o. male with history of WPW, SVT  He saw Dr. Lovena Le last Nov 2022, had a recent SVT treated with adenosine.  Recommended EPS/ablation, the pt preferred to hold off on procedure and BB.  ER visit 05/30/22 with SVT associated with CP, vagal maneuvers x2 converted him.  Discharged from the ER  ER visit 06/01/22, again w/complaints of tachycardia with reported of several episodes in the last month, some self resolved, others took ativan and resolved. His EKG was SR, he also reported that he does struggle with anxiety and unsure if perhaps he had a panic attack.  In d/w cardiology on call, planned to start lopressor and f/u outpt. The pt without insurance and worries about cost of medications, procedures.  They were able to get him an appt with a financial counselor to help him with this/bills, etc.  TODAY No further tachycardias since his last ER visit. He reports that after his divorce years ago, when his ex-wife left he felt like she took his SVT with her, clearly that stressor was a trigger. Had done well, though unfortunately again running into more life stressors.  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 He has also found a number of other triggers, antihistamines, and some others including caffeine, stimulants that have been triggers as well.  Vagal maneuvers do help but not every time He took the metoprolol a few days but make him feel heavy in the chest or SOB so stopped it.  He still has it and plans to try it PRN if needed.  He feels very wiped out after an episode and seems recovery from  the last has taken longer.  He explains that he is very anxious, analytical, tends to over-think things and occasionally has trouble with letting his mind get the better of him.   Past Medical History:  Diagnosis Date   Acute non-recurrent frontal sinusitis 08/19/2021   Acute recurrent maxillary sinusitis 12/10/2021   Back pain    Kidney stones    Wolff-Parkinson-White (WPW) syndrome     Past Surgical History:  Procedure Laterality Date   KNEE SURGERY Left 1985    Current Outpatient Medications  Medication Sig Dispense Refill   hydrocortisone (ANUSOL-HC) 25 MG suppository Place 1 suppository (25 mg total) rectally 2 (two) times daily. 12 suppository 0   sertraline (ZOLOFT) 50 MG tablet Take 1 tablet (50 mg total) by mouth daily. For anxiety 90 tablet 0   sildenafil (REVATIO) 20 MG tablet TAKE 2-5 TABLETS BY MOUTH AS NEEDED 30  MINUTES  PRIOR  TO  INTERCOURSE. Office visit required for further refills. 50 tablet 0   No current facility-administered medications for this visit.    Allergies:   Patient has no known allergies.   Social History:  The patient  reports that he quit smoking about 41 years ago. His smoking use included cigarettes. He has never been exposed to tobacco smoke. He has never used smokeless tobacco. He reports current alcohol use. He reports that he does not use drugs.   Family History:  The patient's family history includes Alcohol abuse in his  father; COPD in his mother; Cancer in his maternal grandmother; Diabetes in his father; Hypertension in his mother; Prostate cancer in his maternal uncle.  ROS:  Please see the history of present illness.    All other systems are reviewed and otherwise negative.   PHYSICAL EXAM:  VS:  BP 110/80   Pulse 78   Ht 5\' 10"  (1.778 m)   Wt 224 lb 12.8 oz (102 kg)   SpO2 95%   BMI 32.26 kg/m  BMI: Body mass index is 32.26 kg/m. Well nourished, well developed, in no acute distress HEENT: normocephalic, atraumatic Neck:  no JVD, carotid bruits or masses Cardiac:  RRR; no significant murmurs, no rubs, or gallops Lungs:  CTA b/l, no wheezing, rhonchi or rales Abd: soft, nontender MS: no deformity or atrophy Ext:  no edema Skin: warm and dry, no rash Neuro:  No gross deficits appreciated Psych: euthymic mood, full affect  EKG:  not done today Personally reviewed  06/01/22 SR 97. Slight pre-excitation most noted II 05/30/21 SVT 204 SR 80bpm, short PR   Recent Labs: 12/01/2021: ALT 26 05/30/2022: BUN 24; Creatinine, Ser 1.03; Hemoglobin 17.7; Platelets 297; Potassium 4.1; Sodium 137  12/01/2021: Cholesterol 270; Direct LDL 169.0; HDL 41.90; Total CHOL/HDL Ratio 6; Triglycerides 433.0 Triglyceride is over 400; calculations on Lipids are invalid.   Estimated Creatinine Clearance: 89 mL/min (by C-G formula based on SCr of 1.03 mg/dL).   Wt Readings from Last 3 Encounters:  06/10/22 224 lb 12.8 oz (102 kg)  06/09/22 226 lb (102.5 kg)  06/08/22 229 lb (103.9 kg)     Other studies reviewed: Additional studies/records reviewed today include: summarized above  ASSESSMENT AND PLAN:  WPW SVT He has been in touch with [facility] financial assistance and is waiting to get the paperwork in the mail to get the process going, but understands once completed and processed will have coverage for procedures/hospital based care for 6 mo.  Once this is in place he very much wants to pursue EPS/ablation  In the meantime, I have advised avoiding stressors/triggers as much as able Vagal maneuvers, prn metoprolol and ER if needed  Disposition: F/u with Dr. Lovena Le in 12mo, hopefully ready to discuss/plan ablation by then  Current medicines are reviewed at length with the patient today.  The patient did not have any concerns regarding medicines.  Venetia Night, PA-C 06/10/2022 10:49 AM     [facility] [address] [place] [address] [phone] (office)  [phone] (fax)

## 2022-06-08 ENCOUNTER — Telehealth: Payer: Self-pay | Admitting: Primary Care

## 2022-06-08 ENCOUNTER — Encounter: Payer: Self-pay | Admitting: Primary Care

## 2022-06-08 ENCOUNTER — Ambulatory Visit (INDEPENDENT_AMBULATORY_CARE_PROVIDER_SITE_OTHER): Payer: Self-pay | Admitting: Primary Care

## 2022-06-08 VITALS — BP 118/68 | HR 75 | Temp 97.5°F | Ht 70.0 in | Wt 229.0 lb

## 2022-06-08 DIAGNOSIS — F419 Anxiety disorder, unspecified: Secondary | ICD-10-CM

## 2022-06-08 DIAGNOSIS — I456 Pre-excitation syndrome: Secondary | ICD-10-CM

## 2022-06-08 DIAGNOSIS — F32A Depression, unspecified: Secondary | ICD-10-CM

## 2022-06-08 MED ORDER — SERTRALINE HCL 50 MG PO TABS: 50.0000 mg | ORAL_TABLET | Freq: Every day | ORAL | 0 refills | Status: DC

## 2022-06-08 NOTE — Assessment & Plan Note (Addendum)
Reviewed hospital notes, labs, imaging from both ED visits.   Deteriorated, anxiety and diet are causative variables. He agrees to anxiety treatment, see other notes. Discussed to avoid caffeine.   Follow up with cardiology as scheduled.  Remain off metoprolol tartrate.

## 2022-06-08 NOTE — Telephone Encounter (Signed)
Pt called stating he saw Michael Farley today, 06/08/22 & wanted a call back from nurse to discuss further things he forgot to mention during visit with Clark. Call back # [phone]

## 2022-06-08 NOTE — Telephone Encounter (Signed)
Called patient and he stated he wanted to bring up a couple things at his appointment today and forgot.   He is still having issues with internal hemorrhoids, now for over 6 months. He has used Preparation H, but this has not helped and states it increases his heart rate. He would like additional advice on what to do about these.  Both of his ears have been leaking fluid when he wakes up in the morning they are filled with fluid and are very itchy. States this has been going on for a few weeks now and he needs some relief.   Advised the patient that these things would need to be addressed at a separate office visit as they require examination and further discussion. He insisted I send his concerns through a message to Anda Kraft and states the reason he forgot to mention today was because he felt the appointment was hurried and rushed.  Please advise.

## 2022-06-08 NOTE — Telephone Encounter (Signed)
Pleas kindly notify patient that I am happy to address those concerns during another office visit.   We had a lot to discuss today (ED follow up and anxiety) and so we ran out of time. It's not always possible to discuss a lot of issues during one visit as they each will need time and attention.   Happy to see him again.

## 2022-06-08 NOTE — Patient Instructions (Signed)
Start sertraline (Zoloft) 50 mg for anxiety and depression. Take 1/2 tablet by mouth once daily for about one week, then increase to 1 full tablet thereafter.   Follow up with cardiology as scheduled.   Please schedule a follow up visit for 6 weeks for follow up of anxiety/depression.  It was a pleasure to see you today!

## 2022-06-08 NOTE — Telephone Encounter (Signed)
Called patient and reviewed [name] message. Scheduled appointment 06/09/22 @ 12:20

## 2022-06-08 NOTE — Addendum Note (Signed)
Addended by: Pleas Koch on: 06/08/2022 11:14 AM   Modules accepted: Orders

## 2022-06-08 NOTE — Assessment & Plan Note (Signed)
Deteriorated.   Discussed options for treatment including therapy and medication.  He kindly declines therapy, but plans on finding a faith based therapist.   Start Zoloft 50 mg daily.  Patient is to take 1/2 tablet daily for 8 days, then advance to 1 full tablet thereafter. We discussed possible side effects of headache, GI upset, drowsiness, and SI/HI. If thoughts of SI/HI develop, we discussed to present to the emergency immediately. Patient verbalized understanding.   Follow up in 6 weeks for re-evaluation.

## 2022-06-08 NOTE — Progress Notes (Signed)
Subjective:    Patient ID: Michael Farley, male    DOB: [birth year], 62 y.o.   MRN: [id number]  HPI  Michael Farley is a very pleasant 62 y.o. male  has a past medical history of Acute non-recurrent frontal sinusitis (08/19/2021), Acute recurrent maxillary sinusitis (12/10/2021), Back pain, Kidney stones, and Wolff-Parkinson-White (WPW) syndrome. who presents today for emergency department follow-up.  He initially presented to [facility] on 05/30/2022 for a 40-minute duration of palpitations.  Upon arrival his heart rate was 198 per EKG.  He has a chronic history of Parkinson's White, evaluated by cardiology who recommended ablation but he never proceeded.  Prior to symptom onset he did have some coffee.  During his stay in the ED he underwent vagal maneuvers x 2, his heart rate eventually converted to normal sinus rhythm with rate in the upper 80s/low 90s.  Labs were grossly unremarkable, except for slight elevation in troponin which improved with resolve of symptoms.  His chest x-ray was negative.  He was discharged home later that evening.  He presented to [facility] again on 06/01/2022 for 15-minute duration of palpitations that resolved upon arrival.  During his visit he mentioned chronic anxiety, takes Xanax as needed.  Cardiology was consulted who recommended Lopressor 25 mg twice daily and close outpatient follow-up.  He will likely need EP study and ablation in the future.  He is uninsured which has become a barrier to follow-up and treatment for his Wolff-Parkinson-White.  He was discharged home later that afternoon.  Since his last emergency department visit his symptoms have abated. He did feel some mild version of his palpitations this morning. Over the last year he's experienced 12 episodes of palpitations. He's been under a lot of personal stress with family and relationships, and also stress with work and finances. He admits to increased caffeine consumption  over the last year.   He stopped taking Lopressor as it caused chest heaviness and shortness of breath. He has an appointment with a financial assistance agent and also with the cardiologist.   He experiences daily anxiety symptoms which includes over thinking and over analyzing things, feeling anxious, worrying. Historically, he manages his anxiety with self calming techniques and rare use of xanax.   Review of Systems  Respiratory:  Negative for shortness of breath.   Cardiovascular:  Positive for palpitations. Negative for chest pain.  Psychiatric/Behavioral:  The patient is nervous/anxious.        See HPI         Past Medical History:  Diagnosis Date   Acute non-recurrent frontal sinusitis 08/19/2021   Acute recurrent maxillary sinusitis 12/10/2021   Back pain    Kidney stones    Wolff-Parkinson-White (WPW) syndrome     Social History   Socioeconomic History   Marital status: Divorced    Spouse name: Not on file   Number of children: Not on file   Years of education: Not on file   Highest education level: Not on file  Occupational History   Not on file  Tobacco Use   Smoking status: Former    Types: Cigarettes    Quit date: 51    Years since quitting: 41.0    Passive exposure: Never   Smokeless tobacco: Never  Substance and Sexual Activity   Alcohol use: Yes   Drug use: No   Sexual activity: Not on file  Other Topics Concern   Not on file  Social History Narrative   Single.  1 child.   Works as a Armed forces operational officer.    Enjoys spending time with his girlfriend, spending time at the beach.    Social Determinants of Health   Financial Resource Strain: Not on file  Food Insecurity: Not on file  Transportation Needs: Not on file  Physical Activity: Not on file  Stress: Not on file  Social Connections: Not on file  Intimate Partner Violence: Not on file    Past Surgical History:  Procedure Laterality Date   KNEE SURGERY Left 1985    Family History   Problem Relation Age of Onset   COPD Mother    Hypertension Mother    Alcohol abuse Father    Diabetes Father    Cancer Maternal Grandmother    Prostate cancer Maternal Uncle    Bladder Cancer Neg Hx    Kidney cancer Neg Hx     No Known Allergies  Current Outpatient Medications on File Prior to Visit  Medication Sig Dispense Refill   amoxicillin-clavulanate (AUGMENTIN) 875-125 MG tablet Take 1 tablet by mouth 2 (two) times daily. (Patient not taking: Reported on 06/08/2022) 20 tablet 1   metoprolol tartrate (LOPRESSOR) 25 MG tablet Take 1 tablet (25 mg total) by mouth 2 (two) times daily. (Patient not taking: Reported on 06/08/2022) 60 tablet 0   sildenafil (REVATIO) 20 MG tablet TAKE 2-5 TABLETS BY MOUTH AS NEEDED 30  MINUTES  PRIOR  TO  INTERCOURSE. Office visit required for further refills. (Patient not taking: Reported on 06/08/2022) 50 tablet 0   No current facility-administered medications on file prior to visit.    BP 118/68   Pulse 75   Temp (!) 97.5 F (36.4 C) (Temporal)   Ht 5\' 10"  (1.778 m)   Wt 229 lb (103.9 kg)   SpO2 98%   BMI 32.86 kg/m  Objective:   Physical Exam Cardiovascular:     Rate and Rhythm: Normal rate and regular rhythm.  Pulmonary:     Effort: Pulmonary effort is normal.     Breath sounds: Normal breath sounds. No wheezing or rales.  Musculoskeletal:     Cervical back: Neck supple.  Skin:    General: Skin is warm and dry.  Neurological:     Mental Status: He is alert and oriented to person, place, and time.  Psychiatric:        Mood and Affect: Mood normal.           Assessment & Plan:  Wolff-Parkinson-White (WPW) syndrome Assessment & Plan: Reviewed hospital notes, labs, imaging from both ED visits.   Deteriorated, anxiety and diet are causative variables. He agrees to anxiety treatment, see other notes. Discussed to avoid caffeine.   Follow up with cardiology as scheduled.  Remain off metoprolol tartrate.    Anxiety and  depression Assessment & Plan: Deteriorated.   Discussed options for treatment including therapy and medication.  He kindly declines therapy, but plans on finding a faith based therapist.   Start Zoloft 50 mg daily.  Patient is to take 1/2 tablet daily for 8 days, then advance to 1 full tablet thereafter. We discussed possible side effects of headache, GI upset, drowsiness, and SI/HI. If thoughts of SI/HI develop, we discussed to present to the emergency immediately. Patient verbalized understanding.   Follow up in 6 weeks for re-evaluation.           Pleas Koch, NP

## 2022-06-09 ENCOUNTER — Ambulatory Visit (INDEPENDENT_AMBULATORY_CARE_PROVIDER_SITE_OTHER): Payer: Self-pay | Admitting: Primary Care

## 2022-06-09 ENCOUNTER — Encounter: Payer: Self-pay | Admitting: Primary Care

## 2022-06-09 ENCOUNTER — Ambulatory Visit: Payer: Self-pay | Admitting: Student

## 2022-06-09 VITALS — BP 124/76 | HR 91 | Temp 97.1°F | Ht 70.0 in | Wt 226.0 lb

## 2022-06-09 DIAGNOSIS — K648 Other hemorrhoids: Secondary | ICD-10-CM

## 2022-06-09 DIAGNOSIS — J309 Allergic rhinitis, unspecified: Secondary | ICD-10-CM

## 2022-06-09 MED ORDER — HYDROCORTISONE ACETATE 25 MG RE SUPP: 25.0000 mg | Freq: Two times a day (BID) | RECTAL | 0 refills | Status: DC

## 2022-06-09 NOTE — Patient Instructions (Signed)
Start using the suppositories twice daily for hemorrhoids.  You will either be contacted via phone regarding your referral to GI, or you may receive a letter on your MyChart portal from our referral team with instructions for scheduling an appointment. Please let us know if you have not been contacted by anyone within two weeks.  Please notify me when you are ready for treatment for allergies.   It was a pleasure to see you today!

## 2022-06-09 NOTE — Assessment & Plan Note (Signed)
Obvious on exam today.  Rx for Anusol [facility] suppositories sent to pharmacy. Referral placed to GI.   He will contact us later if the suppositories are cost prohibitive.

## 2022-06-09 NOTE — Assessment & Plan Note (Signed)
Uncontrolled, but unfortunately he is not a candidate for antihistamine treatment and has failed numerous other treatment regimens.  Would consider carbinoxamine once he's completed his cardiac ablation and is stable. Continue OTC nasal sprays.  Discussed use of Cortisone cream to the ear canals for itching.

## 2022-06-09 NOTE — Progress Notes (Signed)
Subjective:    Patient ID: Michael Farley, male    DOB: 30-Jul-1960, 62 y.o.   MRN: [id number]  Ear Drainage  Associated symptoms include ear discharge and rhinorrhea. Pertinent negatives include no coughing.    Michael Farley is a very pleasant 62 y.o. male  has a past medical history of Acute non-recurrent frontal sinusitis (08/19/2021), Acute recurrent maxillary sinusitis (12/10/2021), Back pain, Kidney stones, and Wolff-Parkinson-White (WPW) syndrome. who presents today to discuss multiple concerns.  1) Hemorrhoids: Chronic history. Evaluated by Lawerance Bach, NP in March 2023 for increased rectal itching and irritation. Prescribed proctozone 2.5% rectal cream for hemorrhoids and recommended sitz baths.   Today he discusses that he never picked up the protozone cream. He's been trying Preparation H suppositories and cream without improvement.   Since March 2023 he continues to notice constant burning, itching, and pain to the anal area. Intermittent bleeding. He has never seen GI for this before.   He has a history of abscess to the rectum years ago, had to be "cut out", then had complications of infection from feces, had to have another surgery.   2) Post Nasal/Ear Drainage/Chronic Sinus Pressure: Chronic for years. Symptoms include ear drainage, ear fullness, and sinus pressure, nasal congestion, post nasal drip. He drinks 2 gallons of milk within 10 days.   He's tried steroid nasal spray, oral steroids, antihistamines, saline nasal spray. He cannot take antihistamines as it causes tachycardia.   Evaluated by ENT about 1 year ago, was told that he needed a procedure to clean out his nasal/sinus passages. He has not undergone this as it could trigger his SVT. He as an appointment scheduled with cardiology for tomorrow.   Evaluated by Dr. Silvio Pate in July 2023 for ongoing allergy and respiratory symptoms. Little improvement with Cefdinir that was prescribed previously. During this Visit he  was provided   BP Readings from Last 3 Encounters:  06/09/22 124/76  06/08/22 118/68  06/01/22 (!) 142/104      Review of Systems  HENT:  Positive for congestion, ear discharge, postnasal drip and rhinorrhea. Negative for sinus pressure.   Respiratory:  Negative for cough.   Gastrointestinal:  Positive for rectal pain.         Past Medical History:  Diagnosis Date   Acute non-recurrent frontal sinusitis 08/19/2021   Acute recurrent maxillary sinusitis 12/10/2021   Back pain    Kidney stones    Wolff-Parkinson-White (WPW) syndrome     Social History   Socioeconomic History   Marital status: Divorced    Spouse name: Not on file   Number of children: Not on file   Years of education: Not on file   Highest education level: Not on file  Occupational History   Not on file  Tobacco Use   Smoking status: Former    Types: Cigarettes    Quit date: 85    Years since quitting: 41.0    Passive exposure: Never   Smokeless tobacco: Never  Substance and Sexual Activity   Alcohol use: Yes   Drug use: No   Sexual activity: Not on file  Other Topics Concern   Not on file  Social History Narrative   Single.   1 child.   Works as a Armed forces operational officer.    Enjoys spending time with his girlfriend, spending time at the beach.    Social Determinants of Health   Financial Resource Strain: Not on file  Food Insecurity: Not on file  Transportation Needs: Not on file  Physical Activity: Not on file  Stress: Not on file  Social Connections: Not on file  Intimate Partner Violence: Not on file    Past Surgical History:  Procedure Laterality Date   KNEE SURGERY Left 1985    Family History  Problem Relation Age of Onset   COPD Mother    Hypertension Mother    Alcohol abuse Father    Diabetes Father    Cancer Maternal Grandmother    Prostate cancer Maternal Uncle    Bladder Cancer Neg Hx    Kidney cancer Neg Hx     No Known Allergies  Current Outpatient Medications  on File Prior to Visit  Medication Sig Dispense Refill   sertraline (ZOLOFT) 50 MG tablet Take 1 tablet (50 mg total) by mouth daily. For anxiety 90 tablet 0   amoxicillin-clavulanate (AUGMENTIN) 875-125 MG tablet Take 1 tablet by mouth 2 (two) times daily. (Patient not taking: Reported on 06/08/2022) 20 tablet 1   metoprolol tartrate (LOPRESSOR) 25 MG tablet Take 1 tablet (25 mg total) by mouth 2 (two) times daily. (Patient not taking: Reported on 06/08/2022) 60 tablet 0   sildenafil (REVATIO) 20 MG tablet TAKE 2-5 TABLETS BY MOUTH AS NEEDED 30  MINUTES  PRIOR  TO  INTERCOURSE. Office visit required for further refills. (Patient not taking: Reported on 06/08/2022) 50 tablet 0   No current facility-administered medications on file prior to visit.    BP 124/76   Pulse 91   Temp (!) 97.1 F (36.2 C) (Temporal)   Ht 5\' 10"  (1.778 m)   Wt 226 lb (102.5 kg)   SpO2 97%   BMI 32.43 kg/m  Objective:   Physical Exam Exam conducted with a chaperone present.  Constitutional:      General: He is not in acute distress. Pulmonary:     Effort: Pulmonary effort is normal.  Genitourinary:    Rectum: Tenderness and internal hemorrhoid present. No external hemorrhoid.     Comments: Moderate skin irritation noted to anal region. Several internal, tender, hemorrhoids noted. Neurological:     Mental Status: He is alert.           Assessment & Plan:  Hemorrhoids, internal Assessment & Plan: Obvious on exam today.  Rx for Anusol [facility] suppositories sent to pharmacy. Referral placed to GI.   He will contact us later if the suppositories are cost prohibitive.   Orders: -     Ambulatory referral to Gastroenterology -     Hydrocortisone Acetate; Place 1 suppository (25 mg total) rectally 2 (two) times daily.  Dispense: 12 suppository; Refill: 0  Chronic allergic rhinitis Assessment & Plan: Uncontrolled, but unfortunately he is not a candidate for antihistamine treatment and has failed numerous  other treatment regimens.  Would consider carbinoxamine once he's completed his cardiac ablation and is stable. Continue OTC nasal sprays.  Discussed use of Cortisone cream to the ear canals for itching.         Pleas Koch, NP

## 2022-06-10 ENCOUNTER — Ambulatory Visit: Payer: Self-pay | Attending: Physician Assistant | Admitting: Physician Assistant

## 2022-06-10 ENCOUNTER — Encounter: Payer: Self-pay | Admitting: Physician Assistant

## 2022-06-10 VITALS — BP 110/80 | HR 78 | Ht 70.0 in | Wt 224.8 lb

## 2022-06-10 DIAGNOSIS — I471 Supraventricular tachycardia, unspecified: Secondary | ICD-10-CM

## 2022-06-10 DIAGNOSIS — I456 Pre-excitation syndrome: Secondary | ICD-10-CM

## 2022-06-10 MED ORDER — METOPROLOL TARTRATE 25 MG PO TABS: 25.0000 mg | ORAL_TABLET | Freq: Every day | ORAL | 0 refills | Status: DC | PRN

## 2022-06-10 NOTE — Patient Instructions (Signed)
Medication Instructions:    START TAKING:  LOPRESSOR  25 MG AS NEEDED FOR PALPITATIONS  *If you need a refill on your cardiac medications before your next appointment, please call your pharmacy*   Lab Work: NONE ORDERED  TODAY    If you have labs (blood work) drawn today and your tests are completely normal, you will receive your results only by: [facility] (if you have MyChart) OR A paper copy in the mail If you have any lab test that is abnormal or we need to change your treatment, we will call you to review the results.   Testing/Procedures: NONE ORDERED  TODAY    Follow-Up: At [facility], you and your health needs are our priority.  As part of our continuing mission to provide you with exceptional heart care, we have created designated Provider Care Teams.  These Care Teams include your primary Cardiologist (physician) and Advanced Practice Providers (APPs -  Physician Assistants and Nurse Practitioners) who all work together to provide you with the care you need, when you need it.  We recommend signing up for the patient portal called "MyChart".  Sign up information is provided on this After Visit Summary.  MyChart is used to connect with patients for Virtual Visits (Telemedicine).  Patients are able to view lab/test results, encounter notes, upcoming appointments, etc.  Non-urgent messages can be sent to your provider as well.   To learn more about what you can do with MyChart, go to [email or web address].    Your next appointment:    2 month(s) : TO DISCUSS ABLATION    Provider:   Cristopher Peru, MD     Other Instructions

## 2022-06-14 ENCOUNTER — Telehealth: Payer: Self-pay | Admitting: Licensed Clinical Social Worker

## 2022-06-14 ENCOUNTER — Telehealth: Payer: Self-pay | Admitting: Internal Medicine

## 2022-06-14 NOTE — Progress Notes (Unsigned)
Heart and Vascular Care Navigation  06/14/2022  Harvard Zeiss [birth year] [id number]  Reason for Referral: uninsured, ongoing medical work up Patient is participating in a Managed Medicaid Plan:No, self pay, has started Medicaid process with DSS.   Engaged with patient by telephone for initial visit for Heart and Vascular Care Coordination.                                                                                                   Assessment:                                     LCSW was able to reach pt this afternoon at  [phone]. Introduced self, role, reason for call. Pt confirmed home address, PCP, and currently uninsured. He currently lives alone, confirms emergency contact is his brother still at this time. He is self employed- does not have insurance or additional benefits from his work, has been out of work/income recently. Drives himself to and from appts, has back up if needed. Denies active concerns about costs of medication at this time- we discussed pricing affordability at [place] if needed. He has received assistance w/ rent and utilities from family/friends so far. He has gone to DSS to apply for [facility], Medicaid and utility assistance. He thinks that he received a CAFA application from [facility] to complete for past bills. Pt interested in additional assistance.  LCSW discussed CAFA and how to complete. I have sent him a copy started for his convenience. I also discussed our Patient [place] that may be able to assist with rent until he has his ablation completed. Pt interested in this assistance and understands he will need to collect some documentation to apply. I have sent him a packet of this information to his home address at his request.   No additional questions/concerns at this time, encouraged to call LCSW if we can assist further/he decides to apply for Patient Care Fund assistance.    HRT/VAS Care Coordination     Patients Home Cardiology Office [facility]   Outpatient Care Team Social Worker   Social Worker Name: [name], [place], [place]   Living arrangements for the past 2 months Single Family Home   Lives with: Self   Patient Current Insurance Coverage Self-Pay   Patient Has Concern With Paying Medical Bills Yes   Patient Concerns With Medical Bills no coverage, past admission and ongoing medical work up   Medical Bill Referrals: CAFA for past bills- applying for Medicaid for hopeful assistance moving forward   Does Patient Have Prescription Coverage? No   Patient Prescription Assistance Programs Other   Other Assistance Programs Medications [facility]       Social History:  SDOH Screenings   Food Insecurity: Food Insecurity Present (06/14/2022)  Housing: Medium Risk (06/14/2022)  Transportation Needs: No Transportation Needs (06/14/2022)  Utilities: Not At Risk (06/14/2022)  Depression (PHQ2-9): Medium Risk (06/08/2022)  Financial Resource Strain: High Risk (06/14/2022)  Tobacco Use: Medium Risk (06/10/2022)    SDOH Interventions: Financial Resources:  Financial Strain Interventions: Other (Comment) (Patient [place]; [employer]; applying for assistance through [employer]) DSS for financial assistance and Occupational hygienist for [place] Insecurity:  Food Insecurity Interventions: Other (Comment) (pt applied through DSS for SNAP)  Housing Insecurity:  Housing Interventions: Other (Comment) (referral for Patient Care Fund assistance)  Transportation:   Transportation Interventions: Intervention Not Indicated    Other Care Navigation Interventions:     Provided Pharmacy assistance resources Other- discussed utilizing [place] to assist with costs    Follow-up plan:   LCSW mailed pt the following: my card, Sport and exercise psychologist, Patient [place] agreement and information.  I will f/u to ensure paperwork received and answer any additional questions/concerns that may arise.

## 2022-06-14 NOTE — Telephone Encounter (Signed)
Pt contacted [facility] regarding request for social services.    Pt stated he wants social services assistance for history of WPW, and episodes of SVT.  Pt is a patient of [facility] and is requesting a letter from [place] stating an ablation is necessary?  I will consult Dr. Lovena Le on this request.    Pt wants the letter emailed to [email or web address]   And [email or web address]

## 2022-06-14 NOTE — Telephone Encounter (Signed)
Per Dr. Cristopher Peru, Ok to send letter per Pt request to Social [employer], with information requested by Pt.  Letter created, emailed to [email or web address] and PT email.  Pt has upcoming appointment to discuss SVT Ablation with Dr. Lovena Le in March.

## 2022-06-14 NOTE — Telephone Encounter (Signed)
Patient called to talk with Dr. Lovena Le or nurse regarding his condition which is WPW.

## 2022-06-16 ENCOUNTER — Telehealth: Payer: Self-pay | Admitting: Licensed Clinical Social Worker

## 2022-06-16 ENCOUNTER — Telehealth: Payer: Self-pay | Admitting: Internal Medicine

## 2022-06-16 NOTE — Telephone Encounter (Signed)
H&V Care Navigation CSW Progress Note  Clinical Social Worker  was contacted by pt  to f/u on our discussion of patient care fund assistance. Pt aware of what documentation needed. Will speak with landlord regarding what paperwork is needed also. Shared I have mailed a list of this with assistance options also to him, requested he call me if he doesn't receive them by next week. I remain available before that time as needed.   Patient is participating in a Managed Medicaid Plan:  No, self pay only currently.   SDOH Screenings   Food Insecurity: Food Insecurity Present (06/14/2022)  Housing: Medium Risk (06/14/2022)  Transportation Needs: No Transportation Needs (06/14/2022)  Utilities: Not At Risk (06/14/2022)  Depression (PHQ2-9): Medium Risk (06/08/2022)  Financial Resource Strain: High Risk (06/14/2022)  Tobacco Use: Medium Risk (06/10/2022)   [name], MSW, [place]  [phone]- work cell phone (preferred) [phone]- desk phone

## 2022-06-16 NOTE — Telephone Encounter (Signed)
Pt called back per message received in [place] Triage.    Pt wanted [place] to know that he has reached out to case worker regarding financial assistance.  Pt encouraged to provide the information / work with Ms. Michiel Cowboy LCSW for assistance.   Pt also wanted Dr. Lovena Le to know that over the past few months has had 10 - 15 episodes of SVT.  This is known by Dr. Lovena Le, and Ms. Renee PA-C, and on 06/10/22 provider note, will discuss SVT Ablation plan.   Pt seeking financial assistance for SVT Ablation plan.  Pt keeps track of his SVT episodes and wanted [place] to know.    Pt completing paperwork for assistance / working with Education officer, museum.

## 2022-06-16 NOTE — Telephone Encounter (Signed)
Patient states he was returning call. Please advise ?

## 2022-06-17 ENCOUNTER — Telehealth: Payer: Self-pay | Admitting: Licensed Clinical Social Worker

## 2022-06-17 NOTE — Telephone Encounter (Signed)
H&V Care Navigation CSW Progress Note  Pt called this Probation officer stating he was at the hospital hoping to stop in and speak with me about paperwork. Clarified my role and where we are located, encouraged pt to go to financial counseling office if needed at hospital since we are not there currently. I also asked that pt call and we coordinate a time if additional assistance needed I can ensure I am available and pt at the right clinic. I was able to telephonically answer additional questions about documentation needed. I remain available.   Patient is participating in a Managed Medicaid Plan:  No, self pay only.   SDOH Screenings   Food Insecurity: Food Insecurity Present (06/14/2022)  Housing: Medium Risk (06/14/2022)  Transportation Needs: No Transportation Needs (06/14/2022)  Utilities: Not At Risk (06/14/2022)  Depression (PHQ2-9): Medium Risk (06/08/2022)  Financial Resource Strain: High Risk (06/14/2022)  Tobacco Use: Medium Risk (06/10/2022)   [name], MSW, [place]  [phone]- work cell phone (preferred) [phone]- desk phone

## 2022-06-20 ENCOUNTER — Telehealth (HOSPITAL_BASED_OUTPATIENT_CLINIC_OR_DEPARTMENT_OTHER): Payer: Self-pay | Admitting: Licensed Clinical Social Worker

## 2022-06-20 NOTE — Telephone Encounter (Signed)
H&V Care Navigation CSW Progress Note  Clinical Social Worker contacted patient by phone to f/u on missed call from this morning. Pt has completed what he thinks is all needed paperwork for the [employer] . We discussed he could bring it to me or speak with Wynonia Hazard w/ [facility]. Clifton James may be able to receive and submit all paperwork directly into system for processing, I would have to mail it to their dept if it is dropped off with me. Pt will call Clifton James and let me know.   I also spoke w/ pt landlord Paediatric nurse at [phone]. He has sent me W9 and lease for pt. I will coordinate additional proof of income and patient agreement form with the pt.   Patient is participating in a Managed Medicaid Plan: No, self pay only.   SDOH Screenings   Food Insecurity: Food Insecurity Present (06/14/2022)  Housing: Medium Risk (06/14/2022)  Transportation Needs: No Transportation Needs (06/14/2022)  Utilities: Not At Risk (06/14/2022)  Depression (PHQ2-9): Medium Risk (06/08/2022)  Financial Resource Strain: High Risk (06/14/2022)  Tobacco Use: Medium Risk (06/10/2022)   [name], MSW, [place]  [phone]- work cell phone (preferred) [phone]- desk phone

## 2022-06-21 ENCOUNTER — Telehealth: Payer: Self-pay | Admitting: Licensed Clinical Social Worker

## 2022-06-21 NOTE — Telephone Encounter (Signed)
H&V Care Navigation CSW Progress Note  Clinical Social Worker met with patient to f/u on Patient [place] paperwork. Pt and this Probation officer sorted papers and I made copies of bank statement and lease statement to submit to patient care fund as well as assisted with organizing paperwork to submit to financial counselor Clifton James for [employer]. I will f/u once assistance submitted.   Patient is participating in a Managed Medicaid Plan:  No, self pay only.   SDOH Screenings   Food Insecurity: Food Insecurity Present (06/14/2022)  Housing: Medium Risk (06/14/2022)  Transportation Needs: No Transportation Needs (06/14/2022)  Utilities: Not At Risk (06/14/2022)  Depression (PHQ2-9): Medium Risk (06/08/2022)  Financial Resource Strain: High Risk (06/14/2022)  Tobacco Use: Medium Risk (06/10/2022)   [name], MSW, [place]  [phone]- work cell phone (preferred) [phone]- desk phone

## 2022-06-21 NOTE — Telephone Encounter (Signed)
H&V Care Navigation CSW Progress Note  Clinical Social Worker contacted patient by phone to f/u on paperwork. Was able to leave a message, which he returned promptly. Shared that I have received W9 and lease from landlord- I can submit application once I have received proof of income and agreement form from pt. I let him know my hours today and tomorrow in the office. I remain available. He is aware that his rent is greater than one time fund assistance and he is okay with portion left.   Patient is participating in a Managed Medicaid Plan:  No, self pay only.   SDOH Screenings   Food Insecurity: Food Insecurity Present (06/14/2022)  Housing: Medium Risk (06/14/2022)  Transportation Needs: No Transportation Needs (06/14/2022)  Utilities: Not At Risk (06/14/2022)  Depression (PHQ2-9): Medium Risk (06/08/2022)  Financial Resource Strain: High Risk (06/14/2022)  Tobacco Use: Medium Risk (06/10/2022)   [name], MSW, [place]  [phone]- work cell phone (preferred) [phone]- desk phone

## 2022-06-23 ENCOUNTER — Encounter: Payer: Self-pay | Admitting: Gastroenterology

## 2022-06-24 ENCOUNTER — Telehealth: Payer: Self-pay | Admitting: Licensed Clinical Social Worker

## 2022-06-24 NOTE — Telephone Encounter (Signed)
H&V Care Navigation CSW Progress Note  Clinical Social Worker contacted patient by phone to f/u on missing documents for Patient [place] stating. Pt has found support for other portion of the rent that we are unable to assist with this month- he hopes this client will also be able to employ him for jobs next month. Client has submitted letter on pt behalf. I have submitted all for consideration for one time Patient Care Fund assistance. Team lead Kennyth Lose contacted, and I have also contacted landlord to let them know there may be delays in processing assistance check due to volume at [employer], requested consideration of waiving late fee this month. Will f/u once check available.   Patient is participating in a Managed Medicaid Plan:  No, self pay only has submitted for CAFA/Medicaid.   SDOH Screenings   Food Insecurity: Food Insecurity Present (06/14/2022)  Housing: Medium Risk (06/14/2022)  Transportation Needs: No Transportation Needs (06/14/2022)  Utilities: Not At Risk (06/14/2022)  Depression (PHQ2-9): Medium Risk (06/08/2022)  Financial Resource Strain: High Risk (06/14/2022)  Tobacco Use: Medium Risk (06/10/2022)   [name], MSW, [place]  [phone]- work cell phone (preferred) [phone]- desk phone

## 2022-06-28 ENCOUNTER — Telehealth: Payer: Self-pay | Admitting: Licensed Clinical Social Worker

## 2022-06-28 ENCOUNTER — Other Ambulatory Visit: Payer: Self-pay | Admitting: Primary Care

## 2022-06-28 DIAGNOSIS — M25569 Pain in unspecified knee: Secondary | ICD-10-CM

## 2022-06-28 DIAGNOSIS — R6882 Decreased libido: Secondary | ICD-10-CM

## 2022-06-28 MED ORDER — SILDENAFIL CITRATE 20 MG PO TABS: ORAL_TABLET | ORAL | 0 refills | Status: DC

## 2022-06-28 NOTE — Telephone Encounter (Signed)
H&V Care Navigation CSW Progress Note  Clinical Social Worker contacted patient by phone to f/u on check request for Patient Michael Farley. I let him know that I have spoken with landlord and formal request was made yesterday 2/5 as my team lead was out of office Friday. LCSW answered pt questions about CAFA to the best of my ability and confirmed he has spoken with DSS and applied for Medicaid. Pt shared orthopaedic concerns, inquired if covered by assistance w/ CAFA, shared I wasn't sure and encouraged him to call directly and ask and contact PCP for formal referral if needed. I remain available, I will let pt and pt landlord know when check received for mailing.  Patient is participating in a Managed Medicaid Plan:  No, self pay only currently.   SDOH Screenings   Food Insecurity: Food Insecurity Present (06/14/2022)  Housing: Medium Risk (06/14/2022)  Transportation Needs: No Transportation Needs (06/14/2022)  Utilities: Not At Risk (06/14/2022)  Depression (PHQ2-9): Medium Risk (06/08/2022)  Financial Resource Strain: High Risk (06/14/2022)  Tobacco Use: Medium Risk (06/10/2022)   [name], MSW, [place]  [phone]- work cell phone (preferred) [phone]- desk phone

## 2022-06-28 NOTE — Telephone Encounter (Signed)
From: Verita Lamb To: Office of Pleas Koch, NP Sent: 06/28/2022 1:54 PM EST Subject: Medication Renewal Request  Refills have been requested for the following medications:   sildenafil (REVATIO) 20 MG tablet [Michael Farley]  Preferred pharmacy: [facility] [place] ([place] [place] - [address] Delivery method: [employer]

## 2022-06-28 NOTE — Telephone Encounter (Signed)
Do you need to see in office for follow up for referrals?

## 2022-06-29 NOTE — Addendum Note (Signed)
Addended by: Pleas Koch on: 06/29/2022 01:44 PM   Modules accepted: Orders

## 2022-07-01 ENCOUNTER — Encounter: Payer: Self-pay | Admitting: *Deleted

## 2022-07-05 NOTE — Telephone Encounter (Signed)
H&V Care Navigation CSW Progress Note  Clinical Social Worker  mailed pt a copy of signed Patient [place] agreement  to keep for his records. Pt check was completed and mailed on Friday 2/9, email sent to [place] on 2/9 at 10:45am to let them know and requested they let us know when received.   Patient is participating in a Managed Medicaid Plan:  No, self pay- has applied  [place]: Food Insecurity Present (06/14/2022)  Housing: Medium Risk (06/14/2022)  Transportation Needs: No Transportation Needs (06/14/2022)  Utilities: Not At Risk (06/14/2022)  Depression (PHQ2-9): Medium Risk (06/08/2022)  Financial Resource Strain: High Risk (06/14/2022)  Tobacco Use: Medium Risk (06/10/2022)   [name], MSW, [place]  [phone]- work cell phone (preferred) [phone]- desk phone

## 2022-07-15 ENCOUNTER — Encounter (HOSPITAL_COMMUNITY): Payer: Self-pay

## 2022-07-15 ENCOUNTER — Ambulatory Visit (HOSPITAL_COMMUNITY)
Admission: EM | Admit: 2022-07-15 | Discharge: 2022-07-15 | Disposition: A | Discharge status: Home / Self Care | Payer: Medicaid Other | Attending: Family Medicine | Admitting: Family Medicine

## 2022-07-15 ENCOUNTER — Telehealth: Payer: Self-pay | Admitting: Licensed Clinical Social Worker

## 2022-07-15 ENCOUNTER — Ambulatory Visit (INDEPENDENT_AMBULATORY_CARE_PROVIDER_SITE_OTHER): Payer: Medicaid Other

## 2022-07-15 DIAGNOSIS — R059 Cough, unspecified: Secondary | ICD-10-CM | POA: Diagnosis not present

## 2022-07-15 DIAGNOSIS — J111 Influenza due to unidentified influenza virus with other respiratory manifestations: Secondary | ICD-10-CM

## 2022-07-15 DIAGNOSIS — R509 Fever, unspecified: Secondary | ICD-10-CM | POA: Diagnosis not present

## 2022-07-15 LAB — POC INFLUENZA A AND B ANTIGEN (URGENT CARE ONLY)
INFLUENZA A ANTIGEN, POC: POSITIVE — AB
INFLUENZA B ANTIGEN, POC: NEGATIVE

## 2022-07-15 MED ORDER — BENZONATATE 200 MG PO CAPS: 200.0000 mg | ORAL_CAPSULE | Freq: Three times a day (TID) | ORAL | 0 refills | Status: AC | PRN

## 2022-07-15 MED ORDER — OSELTAMIVIR PHOSPHATE 75 MG PO CAPS: 75.0000 mg | ORAL_CAPSULE | Freq: Two times a day (BID) | ORAL | 0 refills | Status: DC

## 2022-07-15 NOTE — ED Triage Notes (Addendum)
Patient reports that he has a productive cough with dark yellow sputum, nasal congestion, and bilateral ear pain and a fever x 2-3 days.  Patient states she had Mucinex 2 hours ago. Patient also had Motrin 400 mg at 1200 today.

## 2022-07-15 NOTE — Discharge Instructions (Signed)
Your flu swab was positive today for flu.  Your chest xray was normal.  I have sent out an antiviral, tamiflu, to treat the flu.  Please get started on this as soon as possible to help reduce the severity and length of your symptoms.  I have also sent out a cough medication.  Please get plenty of rest and fluids in the mean time.

## 2022-07-15 NOTE — Telephone Encounter (Signed)
H&V Care Navigation CSW Progress Note  Clinical Social Worker  received call from pt  to update me that he has received Medicaid. Note on [place] current [facility] plan, ID [id number]. Pt will contact financial counseling to let them know as well. Pt appreciative of team's assistance, no additional questions at this time.   Patient is participating in a Managed Medicaid Plan:  [place] Medicaid  [place]: Food Insecurity Present (06/14/2022)  Housing: Medium Risk (06/14/2022)  Transportation Needs: No Transportation Needs (06/14/2022)  Utilities: Not At Risk (06/14/2022)  Depression (PHQ2-9): Medium Risk (06/08/2022)  Financial Resource Strain: High Risk (06/14/2022)  Tobacco Use: Medium Risk (07/15/2022)    [name], MSW, [place]  [phone]- work cell phone (preferred) [phone]- desk phone

## 2022-07-15 NOTE — ED Provider Notes (Signed)
[facility]    CSN: [id number] Arrival date & time: 07/15/22  1201      History   Chief Complaint Chief Complaint  Patient presents with   Cough   Fever   Otalgia   Nasal Congestion    HPI Michael Farley is a 62 y.o. male.   Patient is here for uri symptoms.  He is having cough, congestion, sore throat, headache, body aches and fever.  Worsening with thick/yellow sputum.  Symptoms started on Wednesday.  No known sick contacts.  He has been using motrin and mucinex.        Past Medical History:  Diagnosis Date   Acute non-recurrent frontal sinusitis 08/19/2021   Acute recurrent maxillary sinusitis 12/10/2021   Back pain    Kidney stones    Wolff-Parkinson-White (WPW) syndrome     Patient Active Problem List   Diagnosis Date Noted   Wolff-Parkinson-White (WPW) syndrome 12/01/2021   Benign paroxysmal positional vertigo due to bilateral vestibular disorder 08/19/2021   Hemorrhoids, internal 08/19/2021   Chronic allergic rhinitis 08/19/2021   SVT (supraventricular tachycardia) 03/31/2021   Anxiety and depression 04/22/2020   Abnormal ejaculation 04/22/2020   Hyperlipidemia 07/13/2018   Decreased sexual ability 03/31/2016   Preventative health care 03/31/2016   History of kidney stones 03/31/2016   Chronic back pain 03/31/2016    Past Surgical History:  Procedure Laterality Date   KNEE SURGERY Left 1985       Home Medications    Prior to Admission medications   Medication Sig Start Date End Date Taking? Authorizing Provider  hydrocortisone (ANUSOL-HC) 25 MG suppository Place 1 suppository (25 mg total) rectally 2 (two) times daily. 06/09/22   Pleas Koch, NP  metoprolol tartrate (LOPRESSOR) 25 MG tablet Take 1 tablet (25 mg total) by mouth daily as needed (PALPATATIONS). 06/10/22 09/08/22  Baldwin Jamaica, PA-C  sertraline (ZOLOFT) 50 MG tablet Take 1 tablet (50 mg total) by mouth daily. For anxiety 06/08/22   Pleas Koch, NP   sildenafil (REVATIO) 20 MG tablet TAKE 2-5 TABLETS BY MOUTH AS NEEDED 30  MINUTES  PRIOR  TO intercourse 06/28/22   Pleas Koch, NP    Family History Family History  Problem Relation Age of Onset   COPD Mother    Hypertension Mother    Alcohol abuse Father    Diabetes Father    Cancer Maternal Grandmother    Prostate cancer Maternal Uncle    Bladder Cancer Neg Hx    Kidney cancer Neg Hx     Social History Social History   Tobacco Use   Smoking status: Former    Types: Cigarettes    Quit date: 1983    Years since quitting: 41.1    Passive exposure: Never   Smokeless tobacco: Never  Vaping Use   Vaping Use: Never used  Substance Use Topics   Alcohol use: Yes   Drug use: No     Allergies   Patient has no known allergies.   Review of Systems Review of Systems  Constitutional:  Positive for chills and fever.  HENT:  Positive for ear pain and rhinorrhea. Negative for sinus pressure.   Respiratory:  Positive for cough.   Gastrointestinal: Negative.   Genitourinary: Negative.   Musculoskeletal: Negative.   Psychiatric/Behavioral: Negative.       Physical Exam Triage Vital Signs ED Triage Vitals [07/15/22 1357]  Enc Vitals Group     BP (!) 149/82     Pulse Rate  80     Resp 18     Temp (!) 100.5 F (38.1 C)     Temp Source Oral     SpO2 94 %     Weight      Height      Head Circumference      Peak Flow      Pain Score 0     Pain Loc      Pain Edu?      Excl. in [place]?    No data found.  Updated Vital Signs BP (!) 149/82 (BP Location: Right Arm)   Pulse 80   Temp (!) 100.5 F (38.1 C) (Oral)   Resp 18   SpO2 94%   Visual Acuity Right Eye Distance:   Left Eye Distance:   Bilateral Distance:    Right Eye Near:   Left Eye Near:    Bilateral Near:     Physical Exam Constitutional:      Appearance: Normal appearance.  HENT:     Right Ear: Tympanic membrane normal.     Left Ear: Tympanic membrane normal.     Nose: Congestion and  rhinorrhea present.     Right Sinus: No maxillary sinus tenderness.     Left Sinus: No maxillary sinus tenderness.     Mouth/Throat:     Mouth: Mucous membranes are moist.     Pharynx: Oropharynx is clear. Posterior oropharyngeal erythema present. No pharyngeal swelling, oropharyngeal exudate or uvula swelling.  Cardiovascular:     Rate and Rhythm: Normal rate and regular rhythm.  Pulmonary:     Effort: Pulmonary effort is normal.     Comments: Rhonchi at the right lower base Musculoskeletal:     Cervical back: Normal range of motion and neck supple. No tenderness.  Lymphadenopathy:     Cervical: No cervical adenopathy.  Neurological:     General: No focal deficit present.     Mental Status: He is alert.  Psychiatric:        Mood and Affect: Mood normal.      UC Treatments / Results  Labs (all labs ordered are listed, but only abnormal results are displayed) Labs Reviewed  POC INFLUENZA A AND B ANTIGEN (URGENT CARE ONLY) - Abnormal; Notable for the following components:      Result Value   INFLUENZA A ANTIGEN, POC POSITIVE (*)    All other components within normal limits    EKG   Radiology DG Chest 2 View  Result Date: 07/15/2022 CLINICAL DATA:  cough and fever EXAM: CHEST - 2 VIEW COMPARISON:  Chest x-ray 05/30/2022. FINDINGS: The heart size and mediastinal contours are within normal limits. Both lungs are clear. No visible pleural effusions or pneumothorax. No acute osseous abnormality. IMPRESSION: No active cardiopulmonary disease. Electronically Signed   By: Margaretha Sheffield M.D.   On: 07/15/2022 14:27    Procedures Procedures (including critical care time)  Medications Ordered in UC Medications - No data to display  Initial Impression / Assessment and Plan / UC Course  I have reviewed the triage vital signs and the nursing notes.  Pertinent labs & imaging results that were available during my care of the patient were reviewed by me and considered in my medical  decision making (see chart for details).    Final Clinical Impressions(s) / UC Diagnoses   Final diagnoses:  Influenza with respiratory manifestation     Discharge Instructions      Your flu swab was positive today for flu.  Your chest xray was normal.  I have sent out an antiviral, tamiflu, to treat the flu.  Please get started on this as soon as possible to help reduce the severity and length of your symptoms.  I have also sent out a cough medication.  Please get plenty of rest and fluids in the mean time.     ED Prescriptions     Medication Sig Dispense Auth. Provider   oseltamivir (TAMIFLU) 75 MG capsule Take 1 capsule (75 mg total) by mouth every 12 (twelve) hours. 10 capsule Bert Givans, MD   benzonatate (TESSALON) 200 MG capsule Take 1 capsule (200 mg total) by mouth 3 (three) times daily as needed for up to 7 days for cough. 21 capsule Rondel Oh, MD      PDMP not reviewed this encounter.   Rondel Oh, MD 07/15/22 [phone]

## 2022-07-18 ENCOUNTER — Other Ambulatory Visit: Payer: Self-pay

## 2022-07-18 ENCOUNTER — Telehealth: Payer: Self-pay | Admitting: Internal Medicine

## 2022-07-18 ENCOUNTER — Encounter (HOSPITAL_BASED_OUTPATIENT_CLINIC_OR_DEPARTMENT_OTHER): Payer: Self-pay

## 2022-07-18 ENCOUNTER — Emergency Department (HOSPITAL_BASED_OUTPATIENT_CLINIC_OR_DEPARTMENT_OTHER): Payer: Medicaid Other

## 2022-07-18 ENCOUNTER — Telehealth: Payer: Self-pay

## 2022-07-18 ENCOUNTER — Emergency Department (HOSPITAL_BASED_OUTPATIENT_CLINIC_OR_DEPARTMENT_OTHER)
Admission: EM | Admit: 2022-07-18 | Discharge: 2022-07-18 | Disposition: A | Discharge status: Home / Self Care | Payer: Medicaid Other | Attending: Emergency Medicine | Admitting: Emergency Medicine

## 2022-07-18 DIAGNOSIS — R197 Diarrhea, unspecified: Secondary | ICD-10-CM | POA: Diagnosis not present

## 2022-07-18 DIAGNOSIS — R42 Dizziness and giddiness: Secondary | ICD-10-CM | POA: Diagnosis present

## 2022-07-18 DIAGNOSIS — I471 Supraventricular tachycardia, unspecified: Secondary | ICD-10-CM

## 2022-07-18 DIAGNOSIS — R5383 Other fatigue: Secondary | ICD-10-CM | POA: Insufficient documentation

## 2022-07-18 LAB — COMPREHENSIVE METABOLIC PANEL
ALT: 18 U/L (ref 0–44)
AST: 20 U/L (ref 15–41)
Albumin: 4 g/dL (ref 3.5–5.0)
Alkaline Phosphatase: 45 U/L (ref 38–126)
Anion gap: 7 (ref 5–15)
BUN: 21 mg/dL (ref 8–23)
CO2: 27 mmol/L (ref 22–32)
Calcium: 9.3 mg/dL (ref 8.9–10.3)
Chloride: 105 mmol/L (ref 98–111)
Creatinine, Ser: 0.83 mg/dL (ref 0.61–1.24)
GFR, Estimated: >60 mL/min (ref >60)
Glucose, Bld: 121 mg/dL — ABNORMAL HIGH (ref 70–99)
Potassium: 4.3 mmol/L (ref 3.5–5.1)
Sodium: 139 mmol/L (ref 135–145)
Total Bilirubin: 0.5 mg/dL (ref 0.3–1.2)
Total Protein: 6.8 g/dL (ref 6.5–8.1)

## 2022-07-18 LAB — CBC WITH DIFFERENTIAL/PLATELET
Abs Immature Granulocytes: 0.01 10*3/uL (ref 0.00–0.07)
Basophils Absolute: 0 10*3/uL (ref 0.0–0.1)
Basophils Relative: 0 %
Eosinophils Absolute: 0.3 10*3/uL (ref 0.0–0.5)
Eosinophils Relative: 6 %
HCT: 45.1 % (ref 39.0–52.0)
Hemoglobin: 15.4 g/dL (ref 13.0–17.0)
Immature Granulocytes: 0 %
Lymphocytes Relative: 28 %
Lymphs Abs: 1.3 10*3/uL (ref 0.7–4.0)
MCH: 28.5 pg (ref 26.0–34.0)
MCHC: 34.1 g/dL (ref 30.0–36.0)
MCV: 83.5 fL (ref 80.0–100.0)
Monocytes Absolute: 0.5 10*3/uL (ref 0.1–1.0)
Monocytes Relative: 10 %
Neutro Abs: 2.7 10*3/uL (ref 1.7–7.7)
Neutrophils Relative %: 56 %
Platelets: 175 10*3/uL (ref 150–400)
RBC: 5.4 MIL/uL (ref 4.22–5.81)
RDW: 13.3 % (ref 11.5–15.5)
WBC: 4.8 10*3/uL (ref 4.0–10.5)
nRBC: 0 % (ref 0.0–0.2)

## 2022-07-18 LAB — TROPONIN I (HIGH SENSITIVITY)
Troponin I (High Sensitivity): 6 ng/L (ref <18)
Troponin I (High Sensitivity): 8 ng/L (ref <18)

## 2022-07-18 LAB — MAGNESIUM: Magnesium: 1.9 mg/dL (ref 1.7–2.4)

## 2022-07-18 MED ORDER — SODIUM CHLORIDE 0.9 % IV BOLUS
1000.0000 mL | Freq: Once | INTRAVENOUS | Status: AC
  Administered 2022-07-18: 1000 mL via INTRAVENOUS

## 2022-07-18 NOTE — Telephone Encounter (Signed)
Pt is scheduled for SVT Ablation on 08/17/22 @ 10:30am.   He stated that he was getting ready to go to the ED at [facility]. He was coughing up a lot of "junk" he feels terrible, weak and like he may pass out. Having chest pain and  arm tingling.  Advised him that it was a good idea to go get checked.

## 2022-07-18 NOTE — ED Triage Notes (Signed)
Patient here POV from Home.  Endorses feeling ill for about a week with Multiple Symptoms including Dizziness, Weakness, Fatigue, Diarrhea, etc.   Diagnosed with Flu 3 Days ago at [facility]. History of SVT and states he has been taking Tamiflu which has caused Patient to be Tachycardic at Home.   NAD Noted during Triage. A&Ox4. CGS 15. Ambulatory.

## 2022-07-18 NOTE — Telephone Encounter (Signed)
Pt called in stating he is ready to sch his ablation

## 2022-07-18 NOTE — ED Notes (Addendum)
Pt ambulated in hallway without assistance. Reports dizziness has improved, endorsing mild weakness and generalized malaise. Melina Copa MD made aware

## 2022-07-18 NOTE — Telephone Encounter (Signed)
noted 

## 2022-07-18 NOTE — Discharge Instructions (Signed)
You were seen in the emergency department for dizziness lightheadedness in the setting of recent flu diagnosis.  Your lab work was unremarkable.  Please keep well-hydrated rest and follow-up with your regular doctor.  Return to the emergency department if any worsening or concerning symptoms.

## 2022-07-18 NOTE — Telephone Encounter (Signed)
[facility] Night - Client TELEPHONE ADVICE RECORD [facility] Patient Name: Michael Farley Gender: Male DOB: September 05, 1960 Age: 62 Y 71 M 12 D Return Phone Number: [phone] (Primary) Address: City/ State/ Zip: [place]  [address] Client [place] Night - Client Client Site [place] Provider AA - PHYSICIAN, NOT LISTED- MD Contact Type Call Who Is Calling Patient / Member / Family / Caregiver Call Type Triage / Clinical Relationship To Patient Self Return Phone Number [phone] (Primary) Chief Complaint Heart palpitations or irregular heartbeat Reason for Call Request to Speak to a Physician Initial Comment Caller states he would like to speak with the on call dr. He is diagnosed with the Flu and tamaflu and causes SVT and he cannot take it. Translation No Disp. Time Eilene Ghazi Time) Disposition Final User 07/16/2022 5:47:33 PM Attempt made - line busy Ahorlu, RN, Shellia Cleverly 07/16/2022 6:09:47 PM FINAL ATTEMPT MADE - no message left Yes Ahorlu, RN, Shellia Cleverly Final Disposition 07/16/2022 6:09:47 PM FINAL ATTEMPT MADE - no message left Yes Ahorlu, RN, Shellia Cleverly Comments User: Bing Neighbors, RN Date/Time Eilene Ghazi Time): 07/16/2022 6:10:15 PM patient's line busy called 3    [place] RECORD [facility] Patient Name: Michael Farley Gender: Male DOB: 09-Dec-1960 Age: 23 Y 44 M 13 D Return Phone Number: [phone] (Primary) Address: City/ State/ Zip: [place]  [address] Client [facility] Night - Client Client Site [place] Provider Alma Friendly - NP Contact Type Call Who Is Calling Patient / Member / Family / Caregiver Call Type Triage / Clinical Relationship To Patient Self Return Phone Number [phone] (Primary) Chief Complaint Heart  palpitations or irregular heartbeat Reason for Call Symptomatic / Request for Tome states they called to schedule appt for respiratory infection and medication caused reaction and heart rate elevated. Additional Comment ON HOLD IN non urgent queue Translation No Nurse Assessment Nurse: Martyn Ehrich, RN, Felicia Date/Time (Eastern Time): 07/16/2022 7:00:29 PM Confirm and document reason for call. If symptomatic, describe symptoms. ---Pt is having cough and congestion. He has had WPW wolf parkinson white for many years and took awhile to dx. He is planning an ablasion. Coughing onset Weds. Productive on Thurs. Yellow UC yesterday dx him with influenza and put him on tamiflu and cough med. He says the tamiflu can cause heart rhythm issues. IN the middle of the night he had 205 beats a minute and he used the technics MD gave him to slow it. Not having high heart rate today. He is still sick and he doesnt want to take tamiflu. He says he wants antx. He is getting sicker and weaker and he wants antx to kill the infection. Temp He feels like his HR is NL since 4:30 or 5 am Does the patient have any new or worsening symptoms? ---Yes Will a triage be completed? ---Yes Related visit to physician within the last 2 weeks? ---Yes Does the PT have any chronic conditions? (i.e. diabetes, asthma, this includes High risk factors for pregnancy, etc.) ---Yes List chronic conditions. ---WPW Is this a behavioral health or substance abuse call? ---No PLEASE NOTE: All timestamps contained within this report are represented as [place] Standard Time. CONFIDENTIALTY NOTICE: This fax transmission is intended only for the addressee. It contains information that is legally privileged, confidential or otherwise protected from use or disclosure. If you are  not the intended recipient, you are strictly prohibited from reviewing, disclosing, copying using or disseminating any of this  information or taking any action in reliance on or regarding this information. If you have received this fax in error, please notify us immediately by telephone so that we can arrange for its return to [place]. Phone: [phone], Toll-Free: [phone], Fax: [phone] Page: 2 of 2 Call Id: [id number] [place]. Time Eilene Ghazi Time) Disposition Final User 07/16/2022 7:17:55 PM Clinical Call Yes Martyn Ehrich RN, Solmon Ice Final Disposition 07/16/2022 7:17:55 PM Clinical Call Yes Martyn Ehrich, RN, Solmon Ice Comments User: Daphene Calamity, RN Date/Time Eilene Ghazi Time): 07/16/2022 7:14:24 PM the caller is very agitated when nurse tried to get certain information and would cycle back around to his initial desire for antx. told him that we need to collect some information to find out how he is and he says "is there something wrong with your hearing?" He keeps going back to the same information and nurse asked him if she could fine tune the info and that she already understands what he said initially. Told him we need to answer some other questions. IT was distressing the nurse greatly. Nurse asked if she could have another nurse help him and he said, "Hell No" and disconnected User: Daphene Calamity, RN Date/Time (Eastern Time): 07/16/2022 7:18:49 PM reported to charge nurse - since pt didnt want to speak to someone else and disconnected nurse is closing i

## 2022-07-18 NOTE — Telephone Encounter (Signed)
I spoke with pt; pt said just waking up this morning and has only coughed x 1 so far today but was prod cough with yellow phlegm.pt said today having less mucus and cough; pt said does not think abx is no longer needed at this point. Pt said he is feeling better today. Pt said has had some wheezing and SOB but is speaking in full sentences now and no CP. Pt is taking motrin and mucinex and that seems to be helping. Pt is going to contact vascular about getting ablation scheduled. Pt was upset that the tamiflu caused pt to have SVT. Pt said the other night heart rate was 205 beats per min. Pt said he took xanax and beta blocker and after about one hr heart rate was 90. Pt went to sleep. Pt said was exhausted in combination with having the flu and the SVT episode. Pt said he was not happy with call when he spoke with nurse over the weekend. I asked pt if he felt he needed FU appt and he said he had not decided if he was going to do FU at [facility] or [facility] or where at this point. I apologized to pt and offered appt if pt decides he wants to FU at [facility]. I advised pt to drink plenty of fluids and get plenty of rest and UC & ED precautions given and pt voiced understanding and said he appreciated the call. Sending the note as FYI to Gentry Fitz NP PCP who is out of office today and Romilda Garret NP who is in office today and [place] CMA.

## 2022-07-18 NOTE — ED Notes (Signed)
RN reviewed discharge instructions with pt. Pt verbalized understanding and had no further questions. VSS upon discharge.  

## 2022-07-18 NOTE — ED Provider Notes (Signed)
[facility] Provider Note   CSN: [id number] Arrival date & time: 07/18/22  1826     History {Add pertinent medical, surgical, social history, OB history to HPI:1} Chief Complaint  Patient presents with   Dizziness    Michael Farley is a 62 y.o. male.  He has a history of WPW.  He is scheduled for an ablation next month.  He was diagnosed with the flu few days ago continues to have cough productive of some yellow sputum and diarrhea.  Said a few days ago his heart rate jumped up to 200 after taking 1 dose of Tamiflu.  He was able to break the rate and get some sleep.  Continues to feel just weak and washed out lightheaded.  Today he experienced some pain across his chest and some numbness and tingling down his right arm into his right hand.  He said he had the pain in his chest and numbness in his arm before usually with episodes of stress.  Currently pain-free.    Dizziness Quality:  Lightheadedness Severity:  Moderate Onset quality:  Gradual Duration:  4 days Timing:  Constant Progression:  Unchanged Chronicity:  New Context: physical activity and standing up   Relieved by:  Nothing Worsened by:  Movement and standing up Ineffective treatments:  Fluids and lying down Associated symptoms: chest pain, diarrhea, shortness of breath and weakness   Associated symptoms: no syncope        Home Medications Prior to Admission medications   Medication Sig Start Date End Date Taking? Authorizing Provider  benzonatate (TESSALON) 200 MG capsule Take 1 capsule (200 mg total) by mouth 3 (three) times daily as needed for up to 7 days for cough. 07/15/22 07/22/22  Rondel Oh, MD  oseltamivir (TAMIFLU) 75 MG capsule Take 1 capsule (75 mg total) by mouth every 12 (twelve) hours. 07/15/22   Piontek, Junie Panning, MD  sildenafil (REVATIO) 20 MG tablet TAKE 2-5 TABLETS BY MOUTH AS NEEDED 30  MINUTES  PRIOR  TO intercourse 06/28/22   Pleas Koch, NP       Allergies    Patient has no known allergies.    Review of Systems   Review of Systems  Constitutional:  Positive for fatigue.  Eyes:  Negative for visual disturbance.  Respiratory:  Positive for shortness of breath.   Cardiovascular:  Positive for chest pain. Negative for syncope.  Gastrointestinal:  Positive for diarrhea.  Skin:  Negative for rash.  Neurological:  Positive for dizziness, weakness and numbness.    Physical Exam Updated Vital Signs BP (!) 158/79   Pulse 68   Temp 99.1 F (37.3 C) (Oral)   Resp 17   Ht '5\' 10"'$  (1.778 m)   Wt 95.3 kg   SpO2 97%   BMI 30.13 kg/m  Physical Exam Vitals and nursing note reviewed.  Constitutional:      General: He is not in acute distress.    Appearance: Normal appearance. He is well-developed.  HENT:     Head: Normocephalic and atraumatic.  Eyes:     Conjunctiva/sclera: Conjunctivae normal.  Cardiovascular:     Rate and Rhythm: Normal rate and regular rhythm.     Heart sounds: No murmur heard. Pulmonary:     Effort: Pulmonary effort is normal. No respiratory distress.     Breath sounds: Normal breath sounds.  Abdominal:     Palpations: Abdomen is soft.     Tenderness: There is no abdominal tenderness. There is  no guarding or rebound.  Musculoskeletal:        General: No deformity.     Cervical back: Neck supple.     Right lower leg: No edema.     Left lower leg: No edema.  Skin:    General: Skin is warm and dry.     Capillary Refill: Capillary refill takes less than 2 seconds.  Neurological:     General: No focal deficit present.     Mental Status: He is alert.     ED Results / Procedures / Treatments   Labs (all labs ordered are listed, but only abnormal results are displayed) Labs Reviewed - No data to display  EKG EKG Interpretation  Date/Time:  Monday July 18 2022 18:34:51 EST Ventricular Rate:  70 PR Interval:  95 QRS Duration: 126 QT Interval:  403 QTC Calculation: 435 R Axis:   15 Text  Interpretation: Sinus rhythm Short PR interval LVH with IVCD and secondary repol abnrm Inferior infarct, age indeterminate inferior ST depressions new from prior 1/24 Confirmed by Aletta Edouard [phone]) on 07/18/2022 6:46:31 PM  Radiology No results found.  Procedures Procedures  {Document cardiac monitor, telemetry assessment procedure when appropriate:1}  Medications Ordered in ED Medications  sodium chloride 0.9 % bolus 1,000 mL (has no administration in time range)    ED Course/ Medical Decision Making/ A&P   {   Click here for ABCD2, HEART and other calculatorsREFRESH Note before signing :1}                          Medical Decision Making Amount and/or Complexity of Data Reviewed Labs: ordered. Radiology: ordered.   This patient complains of ***; this involves an extensive number of treatment Options and is a complaint that carries with it a high risk of complications and morbidity. The differential includes ***  I ordered, reviewed and interpreted labs, which included *** I ordered medication *** and reviewed PMP when indicated. I ordered imaging studies which included *** and I independently    visualized and interpreted imaging which showed *** Additional history obtained from *** Previous records obtained and reviewed *** I consulted *** and discussed lab and imaging findings and discussed disposition.  Cardiac monitoring reviewed, *** Social determinants considered, *** Critical Interventions: ***  After the interventions stated above, I reevaluated the patient and found *** Admission and further testing considered, ***   {Document critical care time when appropriate:1} {Document review of labs and clinical decision tools ie heart score, Chads2Vasc2 etc:1}  {Document your independent review of radiology images, and any outside records:1} {Document your discussion with family members, caretakers, and with consultants:1} {Document social determinants of health  affecting pt's care:1} {Document your decision making why or why not admission, treatments were needed:1} Final Clinical Impression(s) / ED Diagnoses Final diagnoses:  None    Rx / DC Orders ED Discharge Orders     None

## 2022-07-19 ENCOUNTER — Telehealth: Payer: Self-pay

## 2022-07-19 NOTE — Transitions of Care (Post Inpatient/ED Visit) (Unsigned)
   07/19/2022  Name: Michael Farley MRN: [id number] DOB: 08/03/1960  Today's TOC FU Call Status: Today's TOC FU Call Status:: Unsuccessul Call (1st Attempt) Unsuccessful Call (1st Attempt) Date: 07/19/22  Attempted to reach the patient regarding the most recent Inpatient/ED visit.  Follow Up Plan: Additional outreach attempts will be made to reach the patient to complete the Transitions of Care (Post Inpatient/ED visit) call.   [place] LPN [place] Advisor Direct Dial [phone]

## 2022-07-20 NOTE — Transitions of Care (Post Inpatient/ED Visit) (Unsigned)
   07/20/2022  Name: Michael Farley MRN: [id number] DOB: 09-Jul-1960  Today's TOC FU Call Status: Today's TOC FU Call Status:: Unsuccessful Call (2nd Attempt) Unsuccessful Call (1st Attempt) Date: 07/19/22 Unsuccessful Call (2nd Attempt) Date: 07/20/22  Attempted to reach the patient regarding the most recent Inpatient/ED visit.  Follow Up Plan: Additional outreach attempts will be made to reach the patient to complete the Transitions of Care (Post Inpatient/ED visit) call.   [place] LPN [place] Advisor Direct Dial [phone]

## 2022-07-21 ENCOUNTER — Telehealth: Payer: Self-pay

## 2022-07-21 NOTE — Telephone Encounter (Signed)
Pt scheduled for 08/17/22. I went over Instructions and he understands all. He also is aware of how to find Instruction letter on his MyChart.

## 2022-07-21 NOTE — Transitions of Care (Post Inpatient/ED Visit) (Signed)
   07/21/2022  Name: Michael Farley MRN: [id number] DOB: 11/20/1960  Today's TOC FU Call Status: Today's TOC FU Call Status:: Unsuccessful Call (3rd Attempt) Unsuccessful Call (1st Attempt) Date: 07/19/22 Unsuccessful Call (2nd Attempt) Date: 07/20/22 Unsuccessful Call (3rd Attempt) Date: 07/21/22  Attempted to reach the patient regarding the most recent Inpatient/ED visit.  Follow Up Plan: No further outreach attempts will be made at this time. We have been unable to contact the patient.  [place] LPN [place] Advisor Direct Dial [phone]

## 2022-07-27 ENCOUNTER — Ambulatory Visit: Payer: Medicaid Other | Admitting: Gastroenterology

## 2022-08-03 ENCOUNTER — Other Ambulatory Visit: Payer: Medicaid Other

## 2022-08-15 ENCOUNTER — Telehealth: Payer: Self-pay | Admitting: Internal Medicine

## 2022-08-15 NOTE — Telephone Encounter (Signed)
Pt is calling stating that he is having an ablation done on 03/27 and does not have someone that can stay with him. Pt is requesting to be able to stay overnight at the hospital.

## 2022-08-16 NOTE — Telephone Encounter (Signed)
Pt called back per message received in [facility] [place] Triage.    Pt requesting overnight hospital stay, having an SVT Ablation with Dr. Lovena Le, and has nobody with him.    Santiago Glad called at procedure lab scheduling, and they are aware of this request. Ms. Audie Box was messaged an made aware of this request as well.   Pt made aware his request was made known, and accommodations will be made.

## 2022-08-16 NOTE — Pre-Procedure Instructions (Signed)
Instructed patient on the following items: Arrival time 0830 Nothing to eat or drink after midnight No meds AM of procedure Responsible person to drive you home and stay with you for 24 hrs- patient to be admitted no one to stay with patient

## 2022-08-17 ENCOUNTER — Ambulatory Visit (HOSPITAL_COMMUNITY)
Admission: RE | Admit: 2022-08-17 | Discharge: 2022-08-18 | Disposition: A | Discharge status: Home / Self Care | Payer: Medicaid Other | Attending: Internal Medicine | Admitting: Internal Medicine

## 2022-08-17 ENCOUNTER — Ambulatory Visit (HOSPITAL_COMMUNITY)
Admission: RE | Disposition: A | Discharge status: Home / Self Care | Payer: Self-pay | Source: Home / Self Care | Attending: Internal Medicine

## 2022-08-17 ENCOUNTER — Other Ambulatory Visit: Payer: Self-pay

## 2022-08-17 DIAGNOSIS — I456 Pre-excitation syndrome: Secondary | ICD-10-CM | POA: Diagnosis not present

## 2022-08-17 DIAGNOSIS — I471 Supraventricular tachycardia, unspecified: Secondary | ICD-10-CM | POA: Diagnosis not present

## 2022-08-17 DIAGNOSIS — Z8249 Family history of ischemic heart disease and other diseases of the circulatory system: Secondary | ICD-10-CM | POA: Insufficient documentation

## 2022-08-17 HISTORY — PX: SVT ABLATION: EP1225

## 2022-08-17 LAB — CBC
HCT: 49.6 % (ref 39.0–52.0)
Hemoglobin: 16.7 g/dL (ref 13.0–17.0)
MCH: 28.7 pg (ref 26.0–34.0)
MCHC: 33.7 g/dL (ref 30.0–36.0)
MCV: 85.2 fL (ref 80.0–100.0)
Platelets: 231 10*3/uL (ref 150–400)
RBC: 5.82 MIL/uL — ABNORMAL HIGH (ref 4.22–5.81)
RDW: 13.4 % (ref 11.5–15.5)
WBC: 9.8 10*3/uL (ref 4.0–10.5)
nRBC: 0 % (ref 0.0–0.2)

## 2022-08-17 LAB — BASIC METABOLIC PANEL
Anion gap: 12 (ref 5–15)
BUN: 24 mg/dL — ABNORMAL HIGH (ref 8–23)
CO2: 26 mmol/L (ref 22–32)
Calcium: 9.6 mg/dL (ref 8.9–10.3)
Chloride: 101 mmol/L (ref 98–111)
Creatinine, Ser: 0.87 mg/dL (ref 0.61–1.24)
GFR, Estimated: >60 mL/min (ref >60)
Glucose, Bld: 107 mg/dL — ABNORMAL HIGH (ref 70–99)
Potassium: 4.7 mmol/L (ref 3.5–5.1)
Sodium: 139 mmol/L (ref 135–145)

## 2022-08-17 SURGERY — SVT ABLATION

## 2022-08-17 MED ORDER — ONDANSETRON HCL 4 MG/2ML IJ SOLN: 4.0000 mg | Freq: Four times a day (QID) | INTRAMUSCULAR | Status: DC | PRN

## 2022-08-17 MED ORDER — MIDAZOLAM HCL 5 MG/5ML IJ SOLN
INTRAMUSCULAR | Status: DC | PRN
  Administered 2022-08-17: 2 mg via INTRAVENOUS
  Administered 2022-08-17: 1 mg via INTRAVENOUS
  Administered 2022-08-17: 2 mg via INTRAVENOUS

## 2022-08-17 MED ORDER — SODIUM CHLORIDE 0.9% FLUSH: 3.0000 mL | INTRAVENOUS | Status: DC | PRN

## 2022-08-17 MED ORDER — FENTANYL CITRATE (PF) 100 MCG/2ML IJ SOLN
INTRAMUSCULAR | Status: DC | PRN
  Administered 2022-08-17: 25 ug via INTRAVENOUS
  Administered 2022-08-17: 12.5 ug via INTRAVENOUS
  Administered 2022-08-17: 25 ug via INTRAVENOUS

## 2022-08-17 MED ORDER — FENTANYL CITRATE (PF) 100 MCG/2ML IJ SOLN
25.0000 ug | Freq: Once | INTRAMUSCULAR | Status: AC
  Administered 2022-08-17: 25 ug via INTRAVENOUS

## 2022-08-17 MED ORDER — HEPARIN (PORCINE) IN NACL 1000-0.9 UT/500ML-% IV SOLN
INTRAVENOUS | Status: DC | PRN
  Administered 2022-08-17: 500 mL

## 2022-08-17 MED ORDER — ACETAMINOPHEN 325 MG PO TABS
650.0000 mg | ORAL_TABLET | ORAL | Status: DC | PRN
  Administered 2022-08-18: 650 mg via ORAL
  Filled 2022-08-17: qty 2

## 2022-08-17 MED ORDER — SODIUM CHLORIDE 0.9 % IV SOLN: 250.0000 mL | INTRAVENOUS | Status: DC | PRN

## 2022-08-17 MED ORDER — MIDAZOLAM HCL 5 MG/5ML IJ SOLN
INTRAMUSCULAR | Status: AC
  Filled 2022-08-17: qty 5

## 2022-08-17 MED ORDER — SODIUM CHLORIDE 0.9% FLUSH: 3.0000 mL | Freq: Two times a day (BID) | INTRAVENOUS | Status: DC

## 2022-08-17 MED ORDER — ADENOSINE 6 MG/2ML IV SOLN
INTRAVENOUS | Status: AC
  Filled 2022-08-17: qty 2

## 2022-08-17 MED ORDER — FENTANYL CITRATE (PF) 100 MCG/2ML IJ SOLN
INTRAMUSCULAR | Status: AC
  Filled 2022-08-17: qty 2

## 2022-08-17 MED ORDER — SODIUM CHLORIDE 0.9 % IV SOLN: INTRAVENOUS | Status: DC

## 2022-08-17 MED ORDER — BUPIVACAINE HCL (PF) 0.25 % IJ SOLN
INTRAMUSCULAR | Status: DC | PRN
  Administered 2022-08-17: 20 mL

## 2022-08-17 MED ORDER — HEPARIN SODIUM (PORCINE) 1000 UNIT/ML IJ SOLN
INTRAMUSCULAR | Status: AC
  Filled 2022-08-17: qty 10

## 2022-08-17 MED ORDER — BUPIVACAINE HCL (PF) 0.25 % IJ SOLN
INTRAMUSCULAR | Status: AC
  Filled 2022-08-17: qty 30

## 2022-08-17 SURGICAL SUPPLY — 12 items
BAG SNAP BAND KOVER 36X36 (MISCELLANEOUS) IMPLANT
CATH DUODECA HALO/ISMUS 7FR (CATHETERS) IMPLANT
CATH EZ STEER NAV 4MM D-F CUR (ABLATOR) IMPLANT
CATH JOSEPH QUAD ALLRED 6F REP (CATHETERS) IMPLANT
CATH POLARIS X REPROCESSED (CATHETERS) IMPLANT
PACK EP LATEX FREE (CUSTOM PROCEDURE TRAY) ×1
PACK EP LF (CUSTOM PROCEDURE TRAY) ×1 IMPLANT
PAD DEFIB RADIO PHYSIO CONN (PAD) ×1 IMPLANT
PATCH CARTO3 (PAD) IMPLANT
SHEATH PINNACLE 6F 10CM (SHEATH) IMPLANT
SHEATH PINNACLE 7F 10CM (SHEATH) IMPLANT
SHEATH PINNACLE 8F 10CM (SHEATH) IMPLANT

## 2022-08-17 NOTE — Progress Notes (Signed)
Site area: rt groin 3 venous sheaths Site Prior to Removal:  Level 0 Pressure Applied For: 15 minutes Manual:   yes Patient Status During Pull:  stable Post Pull Site:  Level 0 Post Pull Instructions Given:  yes Post Pull Pulses Present: rt dp palpable Dressing Applied:  gauze and tegaderm Bedrest begins @ 1430 Comments:

## 2022-08-17 NOTE — H&P (Signed)
HPI Mr. 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 He carries this diagnosis dating back to the 1980's. He has had spells over the year but no syncope recently. He has known SVT. He sought medical attention several weeks ago when he consumed too much caffeine and developed sustained palpitations and was found to have SVT at around 200/min. He was treated with IV adenosine restoring NSR. He remains active and has no limit to physical activity. He almost never has SVT unless he over consumes caffeine or chocolate. In SVT he gets sob and has chest pressure. No syncope.  No Known Allergies           Current Outpatient Medications  Medication Sig Dispense Refill   sildenafil (REVATIO) 20 MG tablet TAKE 2 TO 5 TABLETS BY MOUTH AS NEEDED 30  MINUTES  PRIOR  TO  INTERCOURSE 50 tablet 0    No current facility-administered medications for this visit.            Past Medical History:  Diagnosis Date   Back pain     Kidney stones     Wolff-Parkinson-White (WPW) syndrome        ROS:    All systems reviewed and negative except as noted in the HPI.          Past Surgical History:  Procedure Laterality Date   KNEE SURGERY Left 1985             Family History  Problem Relation Age of Onset   COPD Mother     Hypertension Mother     Alcohol abuse Father     Diabetes Father     Cancer Maternal Grandmother     Prostate cancer Maternal Uncle     Bladder Cancer Neg Hx     Kidney cancer Neg Hx          Social History         Socioeconomic History   Marital status: Married      Spouse name: Not on file   Number of children: Not on file   Years of education: Not on file   Highest education level: Not on file  Occupational History   Not on file  Tobacco Use   Smoking status: Never   Smokeless tobacco: Never  Substance and Sexual Activity   Alcohol use: Yes   Drug use: No   Sexual activity: Not on file  Other Topics Concern   Not on file  Social  History Narrative    Single.    1 child.    Works as a Armed forces operational officer.     Enjoys spending time with his girlfriend, spending time at the beach.     Social Determinants of Health    Financial Resource Strain: Not on file  Food Insecurity: Not on file  Transportation Needs: Not on file  Physical Activity: Not on file  Stress: Not on file  Social Connections: Not on file  Intimate Partner Violence: Not on file        BP 124/68   Pulse 80   Ht 5\' 10"  (1.778 m)   Wt 223 lb (101.2 kg)   SpO2 94%   BMI 32.00 kg/m    Physical Exam:   Well appearing middle aged man, NAD HEENT: Unremarkable Neck:  6 cm JVD, no thyromegally Lymphatics:  No adenopathy Back:  No CVA tenderness Lungs:  Clear with no wheezes HEART:  Regular  rate rhythm, no murmurs, no rubs, no clicks Abd:  soft, positive bowel sounds, no organomegally, no rebound, no guarding Ext:  2 plus pulses, no edema, no cyanosis, no clubbing Skin:  No rashes no nodules Neuro:  CN II through XII intact, motor grossly intact   EKG - nsr with ventricular pre-excitation.   Assess/Plan:  WPW syndrome - I have recommended EP study and catheter ablation but he is inclined to wait and try to avoid caffeine and stimulants.  SVT - he is not on a beta blocker but not interested at this point.   Salome Spotted  EP Attending  Since I saw the patient last over a year ago, he has had recurrent episodes of SVT at rates of over 200/min. I have discussed the indications/risks/benefits/goals/expectations and he wishes to proceed.  Carleene Overlie Patrina Andreas,MD

## 2022-08-17 NOTE — Progress Notes (Signed)
Bed rest is over, patient sit at bed side, then transferred to the chair and ambulated to bathroom.

## 2022-08-17 NOTE — Progress Notes (Signed)
Patient arrived at the unit from Cath Lab,chg bath given,vitals checked, CCMD notified,rt groin level 0,bilateral dorsalis pulse doppler ,pt oriented to the unit

## 2022-08-18 ENCOUNTER — Ambulatory Visit: Payer: Self-pay | Admitting: Internal Medicine

## 2022-08-18 ENCOUNTER — Encounter (HOSPITAL_COMMUNITY): Payer: Self-pay | Admitting: Internal Medicine

## 2022-08-18 DIAGNOSIS — I471 Supraventricular tachycardia, unspecified: Secondary | ICD-10-CM

## 2022-08-18 DIAGNOSIS — Z8249 Family history of ischemic heart disease and other diseases of the circulatory system: Secondary | ICD-10-CM | POA: Diagnosis not present

## 2022-08-18 DIAGNOSIS — I456 Pre-excitation syndrome: Secondary | ICD-10-CM | POA: Diagnosis not present

## 2022-08-18 MED FILL — Heparin Sodium (Porcine) Inj 1000 Unit/ML: INTRAMUSCULAR | Qty: 10 | Status: AC

## 2022-08-18 NOTE — Discharge Instructions (Signed)

## 2022-08-18 NOTE — Discharge Summary (Addendum)
ELECTROPHYSIOLOGY PROCEDURE DISCHARGE SUMMARY    Patient ID: Michael Farley,  MRN: [id number], DOB/AGE: 63-Dec-1962 63 y.o.  Admit date: 08/17/2022 Discharge date: 08/18/2022  Primary Care Physician: Pleas Koch, NP  Primary Cardiologist: None  Electrophysiologist: Dr. Lovena Le   Primary Discharge Diagnosis:  Supra-ventricular Tachycardia AVNRT WPW pathway  Procedures This Admission:  1.  Electrophysiology study and radiofrequency catheter ablation of Supra-ventricular Tachycardia on 3/27 by Dr. Lovena Le .  This study demonstrated; 1. Sinus rhythm upon presentation.  2. The patient had dual AV nodal physiology with easily inducible classic AV nodal reentrant tachycardia,  3. There was a residual right free wall AP causing manifest pre-excitation after SVT ablation and it was successfully ablated as well. 3. Successful radiofrequency modification of the slow AV nodal pathway and right free wall AP. 4. No inducible arrhythmias following ablation.  5. No early apparent complications.         Brief HPI: Michael Farley is a 62 y.o. male with a history of Supra-ventricular Tachycardia felt to potentially be . Marland Kitchen Risks, benefits, and alternatives to catheter ablation of Supra-ventricular Tachycardia were reviewed with the patient who wished to proceed.   Hospital Course:  The patient was admitted and underwent EPS/RFCA of Supra-ventricular Tachycardia with details as outlined above.  They were monitored on telemetry overnight which demonstrated NSR without obvious pre-excitation.  Groin was without complication on the day of discharge.  The patient was examined and considered to be stable for discharge.  Wound care and restrictions were reviewed with the patient.  The patient will be seen back by Dr. Lovena Le in 4 weeks.   Physical Exam: Vitals:   08/17/22 1946 08/17/22 2304 08/18/22 0254 08/18/22 0716  BP:  (!) 146/79 132/79 (!) 145/97  Pulse:  73 70 75  Resp:  17 16 (!) 21   Temp: 97.6 F (36.4 C) 98 F (36.7 C) 98.5 F (36.9 C) 97.6 F (36.4 C)  TempSrc: Oral Oral Oral Oral  SpO2:  96% 98% 96%  Weight:      Height:        GEN- NAD. A&O x 3.  HEENT: Normocephalic, atraumatic Lungs- CTAB, Normal effort.  Heart- RRR, No M/G/R.  GI- Soft, NT, ND.  Extremities- No clubbing, cyanosis, or edema;  Skin- warm and dry, no rash or lesion, left chest without hematoma/ecchymosis  Discharge Medications:  Allergies as of 08/18/2022   No Known Allergies      Medication List     TAKE these medications    oseltamivir 75 MG capsule Commonly known as: TAMIFLU Take 1 capsule (75 mg total) by mouth every 12 (twelve) hours.   sildenafil 20 MG tablet Commonly known as: REVATIO TAKE 2-5 TABLETS BY MOUTH AS NEEDED 30  MINUTES  PRIOR  TO intercourse        Disposition:    Follow-up Information     Evans Lance, MD Follow up.   Specialty: Cardiology Why: on 4/25 at 12 noon for post ablation follow up Contact information: [address] [address] [phone]                 Duration of Discharge Encounter: Greater than 30 minutes including physician time.  Jacalyn Lefevre, PA-C  08/18/2022 9:17 AM  EP Attending  No chest pain or sob. His back is sore. His groin demonstrates no hematoma and tele with NSR and no ventricular pre-excitation. He will be discharged home with followup as noted  above.  Carleene Overlie Brodie Correll,MD

## 2022-09-01 ENCOUNTER — Ambulatory Visit: Payer: Self-pay | Admitting: Internal Medicine

## 2022-09-15 ENCOUNTER — Ambulatory Visit: Payer: Medicaid Other | Attending: Internal Medicine | Admitting: Internal Medicine

## 2022-09-16 ENCOUNTER — Encounter: Payer: Self-pay | Admitting: Internal Medicine

## 2022-11-25 ENCOUNTER — Emergency Department (HOSPITAL_BASED_OUTPATIENT_CLINIC_OR_DEPARTMENT_OTHER): Payer: Medicaid Other

## 2022-11-25 ENCOUNTER — Emergency Department (HOSPITAL_BASED_OUTPATIENT_CLINIC_OR_DEPARTMENT_OTHER)
Admission: EM | Admit: 2022-11-25 | Discharge: 2022-11-25 | Disposition: A | Discharge status: Home / Self Care | Payer: Medicaid Other | Attending: Emergency Medicine | Admitting: Emergency Medicine

## 2022-11-25 ENCOUNTER — Encounter (HOSPITAL_BASED_OUTPATIENT_CLINIC_OR_DEPARTMENT_OTHER): Payer: Self-pay | Admitting: Emergency Medicine

## 2022-11-25 ENCOUNTER — Other Ambulatory Visit: Payer: Self-pay

## 2022-11-25 DIAGNOSIS — K5900 Constipation, unspecified: Secondary | ICD-10-CM | POA: Diagnosis present

## 2022-11-25 DIAGNOSIS — R1031 Right lower quadrant pain: Secondary | ICD-10-CM | POA: Insufficient documentation

## 2022-11-25 LAB — COMPREHENSIVE METABOLIC PANEL
ALT: 18 U/L (ref 0–44)
AST: 18 U/L (ref 15–41)
Albumin: 4.5 g/dL (ref 3.5–5.0)
Alkaline Phosphatase: 55 U/L (ref 38–126)
Anion gap: 10 (ref 5–15)
BUN: 20 mg/dL (ref 8–23)
CO2: 25 mmol/L (ref 22–32)
Calcium: 9.8 mg/dL (ref 8.9–10.3)
Chloride: 104 mmol/L (ref 98–111)
Creatinine, Ser: 0.72 mg/dL (ref 0.61–1.24)
GFR, Estimated: >60 mL/min (ref >60)
Glucose, Bld: 148 mg/dL — ABNORMAL HIGH (ref 70–99)
Potassium: 4.1 mmol/L (ref 3.5–5.1)
Sodium: 139 mmol/L (ref 135–145)
Total Bilirubin: 0.8 mg/dL (ref 0.3–1.2)
Total Protein: 6.9 g/dL (ref 6.5–8.1)

## 2022-11-25 LAB — CBC
HCT: 44.2 % (ref 39.0–52.0)
Hemoglobin: 15 g/dL (ref 13.0–17.0)
MCH: 28.8 pg (ref 26.0–34.0)
MCHC: 33.9 g/dL (ref 30.0–36.0)
MCV: 84.8 fL (ref 80.0–100.0)
Platelets: 244 10*3/uL (ref 150–400)
RBC: 5.21 MIL/uL (ref 4.22–5.81)
RDW: 13.3 % (ref 11.5–15.5)
WBC: 8.9 10*3/uL (ref 4.0–10.5)
nRBC: 0 % (ref 0.0–0.2)

## 2022-11-25 LAB — URINALYSIS, ROUTINE W REFLEX MICROSCOPIC
Bilirubin Urine: NEGATIVE
Glucose, UA: NEGATIVE mg/dL
Hgb urine dipstick: NEGATIVE
Ketones, ur: NEGATIVE mg/dL
Leukocytes,Ua: NEGATIVE
Nitrite: NEGATIVE
Protein, ur: NEGATIVE mg/dL
Specific Gravity, Urine: 1.023 (ref 1.005–1.030)
pH: 6.5 (ref 5.0–8.0)

## 2022-11-25 LAB — LIPASE, BLOOD: Lipase: 17 U/L (ref 11–51)

## 2022-11-25 MED ORDER — IOHEXOL 300 MG/ML  SOLN
100.0000 mL | Freq: Once | INTRAMUSCULAR | Status: AC | PRN
  Administered 2022-11-25: 100 mL via INTRAVENOUS

## 2022-11-25 MED ORDER — KETOROLAC TROMETHAMINE 15 MG/ML IJ SOLN
15.0000 mg | Freq: Once | INTRAMUSCULAR | Status: AC
  Administered 2022-11-25: 15 mg via INTRAVENOUS
  Filled 2022-11-25: qty 1

## 2022-11-25 NOTE — ED Provider Notes (Signed)
[place] EMERGENCY DEPARTMENT AT [facility] Provider Note   CSN: [id number] Arrival date & time: 11/25/22  1344     History {Add pertinent medical, surgical, social history, OB history to HPI:1} Chief Complaint  Patient presents with   Abdominal Pain    Akshay Billa is a 62 y.o. male . -intermittent abd pain for the past month - recently became persistent RLQ pain that radiates upward toward the center of the abdomen - no F, N/V/D/C, dysuria - no back or flank pain -no alleviating or aggrvating factors -no testicular pain or swelling    Home Medications Prior to Admission medications   Medication Sig Start Date End Date Taking? Authorizing Provider  oseltamivir (TAMIFLU) 75 MG capsule Take 1 capsule (75 mg total) by mouth every 12 (twelve) hours. 07/15/22   Piontek, Denny Peon, MD  sildenafil (REVATIO) 20 MG tablet TAKE 2-5 TABLETS BY MOUTH AS NEEDED 30  MINUTES  PRIOR  TO intercourse 06/28/22   Doreene Nest, NP      Allergies    Patient has no known allergies.    Review of Systems   Review of Systems  Gastrointestinal:  Positive for abdominal pain.       RLQ pain  All other systems reviewed and are negative.   Physical Exam Updated Vital Signs BP 126/87 (BP Location: Right Arm)   Pulse 70   Temp 98.5 F (36.9 C)   Resp 19  Physical Exam Vitals and nursing note reviewed.  Constitutional:      Appearance: Normal appearance.  HENT:     Head: Normocephalic and atraumatic.     Mouth/Throat:     Mouth: Mucous membranes are moist.  Eyes:     Conjunctiva/sclera: Conjunctivae normal.     Pupils: Pupils are equal, round, and reactive to light.  Cardiovascular:     Rate and Rhythm: Normal rate and regular rhythm.     Pulses: Normal pulses.  Pulmonary:     Effort: Pulmonary effort is normal.     Breath sounds: Normal breath sounds.  Abdominal:     Palpations: Abdomen is soft.     Tenderness: There is abdominal tenderness in the right lower  quadrant. There is no guarding. Negative signs include Rovsing's sign.  Skin:    General: Skin is warm and dry.     Findings: No rash.  Neurological:     General: No focal deficit present.     Mental Status: He is alert.  Psychiatric:        Mood and Affect: Mood normal.        Behavior: Behavior normal.     ED Results / Procedures / Treatments   Labs (all labs ordered are listed, but only abnormal results are displayed) Labs Reviewed  COMPREHENSIVE METABOLIC PANEL - Abnormal; Notable for the following components:      Result Value   Glucose, Bld 148 (*)    All other components within normal limits  LIPASE, BLOOD  CBC  URINALYSIS, ROUTINE W REFLEX MICROSCOPIC    EKG None  Radiology CT ABDOMEN PELVIS W CONTRAST  Result Date: 11/25/2022 CLINICAL DATA:  Pain right lower quadrant of abdomen EXAM: CT ABDOMEN AND PELVIS WITH CONTRAST TECHNIQUE: Multidetector CT imaging of the abdomen and pelvis was performed using the standard protocol following bolus administration of intravenous contrast. RADIATION DOSE REDUCTION: This exam was performed according to the departmental dose-optimization program which includes automated exposure control, adjustment of the mA and/or kV according to patient size and/or  use of iterative reconstruction technique. CONTRAST:  OMNIPAQUE IOHEXOL 300 MG/ML  SOLN COMPARISON:  01/08/2014 FINDINGS: Lower chest: Visualized lower lung fields are clear. Hepatobiliary: There are few smooth marginated low-density lesions in liver measuring up to 3.1 cm in size suggesting hepatic cysts. There is no dilation of bile ducts. Gallbladder is unremarkable. Pancreas: No focal abnormalities are seen. Diverticulum is noted along the inner margin of the duodenum. Spleen: Unremarkable. Adrenals/Urinary Tract: Adrenals are unremarkable. There is no hydronephrosis. There are no renal or ureteral stones. There are small foci of cortical thinning. There are possible tiny cortical cysts  in the kidneys. Urinary bladder is unremarkable. Stomach/Bowel: Stomach is unremarkable. Small bowel loops are not dilated. There is decreased density in submucosal region in terminal ileum. There is no adjacent inflammatory stranding. Appendix is not dilated. Scattered diverticula are seen in colon without signs of focal diverticulitis. Vascular/Lymphatic: Scattered arterial calcifications are seen. Reproductive: There is inhomogeneous attenuation and few small calcifications in prostate. Other: There is no ascites or pneumoperitoneum. Left inguinal hernia containing fat is seen. Musculoskeletal: No acute findings are seen. IMPRESSION: There is no evidence of intestinal obstruction or pneumoperitoneum. There is no hydronephrosis. Appendix is not dilated. There is decreased density in submucosal region in terminal ileum which may suggest nonspecific chronic inflammation. Scattered diverticula seen in the colon without signs of focal acute diverticulitis. Hepatic cysts.  Left inguinal hernia containing fat is seen. Electronically Signed   By: Ernie Avena M.D.   On: 11/25/2022 17:46    Procedures Procedures  {Document cardiac monitor, telemetry assessment procedure when appropriate:1}  Medications Ordered in ED Medications  iohexol (OMNIPAQUE) 300 MG/ML solution 100 mL (100 mLs Intravenous Contrast Given 11/25/22 1720)  ketorolac (TORADOL) 15 MG/ML injection 15 mg (15 mg Intravenous Given 11/25/22 1801)    ED Course/ Medical Decision Making/ A&P   {   Click here for ABCD2, HEART and other calculatorsREFRESH Note before signing :1}                          Medical Decision Making Amount and/or Complexity of Data Reviewed Labs: ordered. Radiology: ordered.  Risk Prescription drug management.   ***  {Document critical care time when appropriate:1} {Document review of labs and clinical decision tools ie heart score, Chads2Vasc2 etc:1}  {Document your independent review of radiology  images, and any outside records:1} {Document your discussion with family members, caretakers, and with consultants:1} {Document social determinants of health affecting pt's care:1} {Document your decision making why or why not admission, treatments were needed:1} Final Clinical Impression(s) / ED Diagnoses Final diagnoses:  None    Rx / DC Orders ED Discharge Orders     None

## 2022-11-25 NOTE — Discharge Instructions (Addendum)
As discussed, your imaging shows that you have a large amount of stool in your colon which can be causing your right lower quadrant pain.  Imaging also shows that you have scar tissue in your colon.  Information for general surgery has been provided for you to call on Monday for further evaluation.  In the interim, try an over-the-counter bowel cleanse for relief of symptoms.   Get help right away if: You have a fever, and your symptoms suddenly get worse. You leak poop or have blood in your poop. Your belly feels hard or bigger than normal (bloated). You have very bad belly pain. You feel dizzy or you faint.

## 2022-11-25 NOTE — ED Triage Notes (Signed)
Pt presents to ED Pov. Pt c/o RLQ abd pain x69m. Pain was intermittent but now is constant and more severe. Denies n/v/d

## 2022-11-25 NOTE — ED Notes (Signed)
RN reviewed discharge instructions with pt. Pt verbalized understanding and had no further questions. VSS upon discharge.  

## 2022-11-28 ENCOUNTER — Telehealth: Payer: Self-pay

## 2022-11-28 NOTE — Transitions of Care (Post Inpatient/ED Visit) (Unsigned)
   11/28/2022  Name: Michael Farley MRN: [id number] DOB: 09-28-1960  Today's TOC FU Call Status: Today's TOC FU Call Status:: Unsuccessul Call (1st Attempt) Unsuccessful Call (1st Attempt) Date: 11/28/22  Attempted to reach the patient regarding the most recent Inpatient/ED visit.  Follow Up Plan: Additional outreach attempts will be made to reach the patient to complete the Transitions of Care (Post Inpatient/ED visit) call.   Signature   Woodfin Ganja LPN [facility] Nurse Health Advisor Direct Dial [phone]

## 2022-11-28 NOTE — Telephone Encounter (Signed)
Patient returned call to the office, requested a call back whenever possible. Please advise, thank you.

## 2022-11-29 NOTE — Transitions of Care (Post Inpatient/ED Visit) (Unsigned)
   11/29/2022  Name: Michael Farley MRN: [id number] DOB: 1961/01/27  Today's TOC FU Call Status: Today's TOC FU Call Status:: Unsuccessful Call (2nd Attempt) Unsuccessful Call (1st Attempt) Date: 11/28/22 Unsuccessful Call (2nd Attempt) Date: 11/29/22  Attempted to reach the patient regarding the most recent Inpatient/ED visit.  Follow Up Plan: Additional outreach attempts will be made to reach the patient to complete the Transitions of Care (Post Inpatient/ED visit) call.   Signature   Woodfin Ganja LPN [facility] Nurse Health Advisor Direct Dial [phone]

## 2022-11-30 NOTE — Transitions of Care (Post Inpatient/ED Visit) (Signed)
   11/30/2022  Name: Michael Farley MRN: [id number] DOB: 04/15/1961  Today's TOC FU Call Status: Today's TOC FU Call Status:: Successful TOC FU Call Competed Unsuccessful Call (1st Attempt) Date: 11/28/22 Unsuccessful Call (2nd Attempt) Date: 11/29/22 Unsuccessful Call (3rd Attempt) Date: 11/30/22 [facility] FU Call Complete Date: 11/30/22  Attempted to reach the patient regarding the most recent Inpatient/ED visit.  Follow Up Plan: No further outreach attempts will be made at this time. We have been unable to contact the patient.  Signature   Woodfin Ganja LPN [facility] Nurse Health Advisor Direct Dial [phone]

## 2023-01-15 ENCOUNTER — Encounter (HOSPITAL_BASED_OUTPATIENT_CLINIC_OR_DEPARTMENT_OTHER): Payer: Self-pay

## 2023-01-15 ENCOUNTER — Emergency Department (HOSPITAL_BASED_OUTPATIENT_CLINIC_OR_DEPARTMENT_OTHER)
Admission: EM | Admit: 2023-01-15 | Discharge: 2023-01-15 | Disposition: A | Discharge status: Home / Self Care | Payer: Medicaid Other | Attending: Emergency Medicine | Admitting: Emergency Medicine

## 2023-01-15 DIAGNOSIS — G8929 Other chronic pain: Secondary | ICD-10-CM

## 2023-01-15 DIAGNOSIS — R030 Elevated blood-pressure reading, without diagnosis of hypertension: Secondary | ICD-10-CM

## 2023-01-15 DIAGNOSIS — M546 Pain in thoracic spine: Secondary | ICD-10-CM | POA: Diagnosis present

## 2023-01-15 MED ORDER — CYCLOBENZAPRINE HCL 10 MG PO TABS
10.0000 mg | ORAL_TABLET | Freq: Once | ORAL | Status: AC
  Administered 2023-01-15: 10 mg via ORAL
  Filled 2023-01-15: qty 1

## 2023-01-15 MED ORDER — MORPHINE SULFATE (PF) 4 MG/ML IV SOLN
4.0000 mg | Freq: Once | INTRAVENOUS | Status: AC
  Administered 2023-01-15: 4 mg via INTRAMUSCULAR
  Filled 2023-01-15: qty 1

## 2023-01-15 MED ORDER — ACETAMINOPHEN 500 MG PO TABS
1000.0000 mg | ORAL_TABLET | Freq: Once | ORAL | Status: AC
  Administered 2023-01-15: 1000 mg via ORAL
  Filled 2023-01-15: qty 2

## 2023-01-15 MED ORDER — KETOROLAC TROMETHAMINE 15 MG/ML IJ SOLN
15.0000 mg | Freq: Once | INTRAMUSCULAR | Status: AC
  Administered 2023-01-15: 15 mg via INTRAMUSCULAR
  Filled 2023-01-15: qty 1

## 2023-01-15 MED ORDER — CYCLOBENZAPRINE HCL 10 MG PO TABS: 10.0000 mg | ORAL_TABLET | Freq: Three times a day (TID) | ORAL | 0 refills | Status: AC | PRN

## 2023-01-15 NOTE — ED Provider Notes (Signed)
[place] EMERGENCY DEPARTMENT AT [facility] Provider Note   CSN: [id number] Arrival date & time: 01/15/23  1025     History  Chief Complaint  Patient presents with   Back Pain    Michael Farley is a 62 y.o. male with past medical history of chronic back pain, kidney stones presents to the ED complaining of uncontrolled upper back pain with paresthesias down his right arm.  He tells me that this has been going on for 20 years and he is taking oxycodone 5 mg at home for his pain.  States that "he wants to find the root of the problem."  Overall no changes in his chronic pain.  No bowel or bladder dysfunction, focal weakness, fever, chest pain, shortness of breath.  No gait changes.  No pain in the lower back or weakness in the legs.  No fall or new injury.  Not currently followed by pain management.       Home Medications Prior to Admission medications   Medication Sig Start Date End Date Taking? Authorizing Provider  cyclobenzaprine (FLEXERIL) 10 MG tablet Take 1 tablet (10 mg total) by mouth 3 (three) times daily as needed for up to 5 days for muscle spasms. 01/15/23 01/20/23 Yes Kwali Wrinkle L, PA-C  oseltamivir (TAMIFLU) 75 MG capsule Take 1 capsule (75 mg total) by mouth every 12 (twelve) hours. 07/15/22   Piontek, Denny Peon, MD  sildenafil (REVATIO) 20 MG tablet TAKE 2-5 TABLETS BY MOUTH AS NEEDED 30  MINUTES  PRIOR  TO intercourse 06/28/22   Doreene Nest, NP      Allergies    Patient has no known allergies.    Review of Systems   Review of Systems  All other systems reviewed and are negative.   Physical Exam Updated Vital Signs BP (!) 177/110 (BP Location: Right Arm)   Pulse 78   Temp 98.2 F (36.8 C) (Oral)   Resp 18   SpO2 99%  Physical Exam Vitals and nursing note reviewed.  Constitutional:      General: He is not in acute distress.    Appearance: Normal appearance. He is not ill-appearing, toxic-appearing or diaphoretic.  HENT:     Head:  Normocephalic and atraumatic.     Mouth/Throat:     Mouth: Mucous membranes are moist.  Eyes:     Conjunctiva/sclera: Conjunctivae normal.  Neck:     Comments: No meningismus Cardiovascular:     Rate and Rhythm: Normal rate and regular rhythm.     Heart sounds: No murmur heard. Pulmonary:     Effort: Pulmonary effort is normal.     Breath sounds: Normal breath sounds.  Abdominal:     General: Abdomen is flat. There is no distension.     Palpations: Abdomen is soft.     Tenderness: There is no abdominal tenderness. There is no guarding or rebound.  Musculoskeletal:     Cervical back: Normal range of motion and neck supple.     Right lower leg: No edema.     Left lower leg: No edema.     Comments: No midline CTL spinal tenderness, step-offs, deformities, moving all extremities equally and spontaneously x 4, 5/5 strength to the bilateral upper and lower extremities  Skin:    General: Skin is warm and dry.     Capillary Refill: Capillary refill takes less than 2 seconds.  Neurological:     General: No focal deficit present.     Mental Status: He is alert  and oriented to person, place, and time.     GCS: GCS eye subscore is 4. GCS verbal subscore is 5. GCS motor subscore is 6.     Cranial Nerves: Cranial nerves 2-12 are intact. No cranial nerve deficit, dysarthria or facial asymmetry.     Motor: Motor function is intact. No weakness, tremor, atrophy, abnormal muscle tone or seizure activity.     Coordination: Coordination is intact.     Gait: Gait is intact.     Deep Tendon Reflexes:     Reflex Scores:      Patellar reflexes are 2+ on the right side and 2+ on the left side.    Comments: Sensation grossly intact to light touch to all extremities  Psychiatric:        Behavior: Behavior normal.     ED Results / Procedures / Treatments   Labs (all labs ordered are listed, but only abnormal results are displayed) Labs Reviewed - No data to display  EKG None  Radiology No  results found.  Procedures Procedures    Medications Ordered in ED Medications  morphine (PF) 4 MG/ML injection 4 mg (4 mg Intramuscular Given 01/15/23 1139)  ketorolac (TORADOL) 15 MG/ML injection 15 mg (15 mg Intramuscular Given 01/15/23 1142)  cyclobenzaprine (FLEXERIL) tablet 10 mg (10 mg Oral Given 01/15/23 1144)  acetaminophen (TYLENOL) tablet 1,000 mg (1,000 mg Oral Given 01/15/23 1144)    ED Course/ Medical Decision Making/ A&P                                 Medical Decision Making Risk OTC drugs. Prescription drug management.   Medical Decision Making:   Michael Farley is a 62 y.o. male who presented to the ED today with back pain detailed above.    Patient's presentation is complicated by their history of chronic pain.  Complete initial physical exam performed, notably the patient  was in no acute distress, nontoxic-appearing.  No midline spinal tenderness, step-offs, or deformities.  No meningismus.  Abdomen soft and nontender.  Equal strength to all extremities x 4.  Ambulatory.  Patellar reflexes intact.    Reviewed and confirmed nursing documentation for past medical history, family history, social history.    Initial Assessment:   With the patient's presentation of back pain, the emergent differential diagnosis for back pain includes but is not limited to fracture, muscle strain, cauda equina, spinal stenosis, DDD, ankylosing spondylitis, acute ligamentous injury, disk herniation, spondylolisthesis, epidural compression syndrome, metastatic cancer, transverse myelitis, vertebral osteomyelitis, diskitis, kidney stone, pyelonephritis, AAA, Perforated ulcer, retrocecal appendicitis, pancreatitis, bowel obstruction, retroperitoneal hemorrhage or mass, meningitis.   Initial Plan:  Symptomatic management Objective evaluation as reviewed    Final Assessment and Plan:   Patient presents to ED c/o back pain. No red flag symptoms. No history of malignancy or IV drug use.  No severe trauma to the back.  History of chronic pain.  Tells me that this is unchanged.  Taking oxycodone 5 mg at home.  Reports that he does not want more home pain medication but wants to get to the root of his problem.  Again no acute red flag symptoms.  Not currently followed by pain management.  Discussed with patient that we can treat his pain here but ultimately he will need to follow-up in the outpatient setting due to his longstanding history of pain.  He is ambulatory, neurologically intact.  He expressed understanding of  this.  Will trial muscle relaxers.  Discussed role of pain management including interventional procedures that can control pain and he is open to this.  Will provide resource guide.  He is hypertensive, could be secondary to uncontrolled pain.  Encourage close monitoring of blood pressure at home and follow-up with PCP.  No chest pain, dyspnea, no LE edema. Strict ED return precautions given, all questions answered, and stable for discharge.   Clinical Impression:  1. Chronic upper back pain   2. Elevated blood pressure reading      Discharge           Final Clinical Impression(s) / ED Diagnoses Final diagnoses:  Chronic upper back pain  Elevated blood pressure reading    Rx / DC Orders ED Discharge Orders          Ordered    cyclobenzaprine (FLEXERIL) 10 MG tablet  3 times daily PRN        01/15/23 1141              Tonette Lederer, PA-C 01/15/23 1149    Ernie Avena, MD 01/15/23 1215

## 2023-01-15 NOTE — Discharge Instructions (Addendum)
We gave you medications to treat the flareup of your back pain.  Continue your home medications as prescribed by your PCP or other outpatient team.  I added muscle relaxers for you to trial at home.  As discussed, you will need close outpatient follow-up with orthopedics or pain management.  I provided multiple pain management clinics that you may follow-up with.  They may want to do additional testing or procedures to treat your back pain. For new or worsening symptoms, return to ED for re-evaluation.   Your blood pressure was also elevated today.  I recommend keeping a record of this at home and following up with your PCP to discuss if you should start blood pressure medication.  It can be elevated due to pain so this may be the case today or you may have developed new onset hypertension.

## 2023-01-15 NOTE — ED Triage Notes (Signed)
He c/o chronic upper back pain and right-sided upper extremity paresthesias. He states he has "lived with this for over 20 years and now it's getting wo bad I need help". He is ambulatory. He denies any lower extremity paresthesias/dysthesias.

## 2023-01-20 ENCOUNTER — Telehealth: Payer: Self-pay

## 2023-01-20 NOTE — Telephone Encounter (Signed)
Noted  

## 2023-01-20 NOTE — Transitions of Care (Post Inpatient/ED Visit) (Signed)
I spoke with pt; pt was seen 01/15/23 at [facility] ED and cyclobenzaprine 10 mg tid is not helping upper back pain. On 01/16/23 pt went to [facility] in [place] and was prescribed lyrica which has not helped back pain. Pt said ortho told pt would need to try 4 - 6 wks of PT  prior to getting MRI. Pt to call [facility] to schedule FU appt after completes PT. Pt to start PT in 2 wks. Pt said bought alcohol at liquor store to help with back pain. Advised pt should not be drinking alcohol but pt said he had to have something to help back pain., pt has not contacted pain mgt for appt. Pt said he has a friend that does pain mgt massage therapy but pt is on medicaid and each visit would be $100.00. pt could not afford that , pt does not want to schedule appt with [facility] about BP because pt said his BP was up at ED due to back pain and not sleeping for 3 - 4 nights due to pain. Pt does not have way to ck BP but pt will ck BP at pharmacy and will call for appt with PCP if BP remains high. UC & ED precautions given and pt voiced understanding. Sending note to Allayne Gitelman NP who is out of office and  Audria Nine NP who is in office.          01/20/2023  Name: Michael Farley MRN: [id number] DOB: [birth year]  Today's TOC FU Call Status: Today's TOC FU Call Status:: Successful TOC FU Call Completed TOC FU Call Complete Date: 01/20/23 Patient's Name and Date of Birth confirmed.  Transition Care Management Follow-up Telephone Call Date of Discharge: 01/15/23 Discharge Facility: [facility]) Type of Discharge: Emergency Department Reason for ED Visit: Other: (for upper back pain; pt thinks pinched nerve ; cycklobenzaprine is not helping back pain and difficulty sleeping.) How have you been since you were released from the hospital?: Same Any questions or concerns?: No  Items Reviewed: Did you receive and understand the discharge instructions provided?: Yes Medications obtained,verified,  and reconciled?: Yes (Medications Reviewed) (pt said the cyclobenzaprine 10 mg is not helping back pain at all. pt was seen [facility] on 01/16/23 and was given lyrica rx and that has not helped upper back pain. pt said he went to liquor store to get alcohol to manage the pain for now.Marland Kitchen) Any new allergies since your discharge?: No Dietary orders reviewed?: NA Do you have support at home?: Yes People in Home: alone Name of Support/Comfort Primary Source: pt said no one  Medications Reviewed Today: Medications Reviewed Today   Medications were not reviewed in this encounter     Home Care and Equipment/Supplies: Were Home Health Services Ordered?: NA Any new equipment or medical supplies ordered?: NA  Functional Questionnaire: Do you need assistance with meal preparation?: No Do you need assistance with eating?: No Do you have difficulty maintaining continence: No Do you need assistance with getting out of bed/getting out of a chair/moving?: No Do you have difficulty managing or taking your medications?: No  Follow up appointments reviewed: PCP Follow-up appointment confirmed?: NA Specialist Hospital Follow-up appointment confirmed?: Yes Date of Specialist follow-up appointment?: 01/16/23 Follow-Up Specialty Provider:: [facility] (pt said [facility] said pt would need PT prior to ordering MRI and pt could not start PT for 2 wks. pt to call [facility] after starting PT with update.) Do you need transportation to your follow-up appointment?:  No Do you understand care options if your condition(s) worsen?: Yes-patient verbalized understanding    SIGNATURE Lewanda Rife, LPN

## 2023-02-02 ENCOUNTER — Other Ambulatory Visit: Payer: Self-pay | Admitting: Primary Care

## 2023-02-02 DIAGNOSIS — R6882 Decreased libido: Secondary | ICD-10-CM

## 2023-02-06 ENCOUNTER — Other Ambulatory Visit: Payer: Self-pay

## 2023-04-07 ENCOUNTER — Other Ambulatory Visit: Payer: Self-pay | Admitting: Primary Care

## 2023-04-07 DIAGNOSIS — R6882 Decreased libido: Secondary | ICD-10-CM

## 2023-04-07 NOTE — Telephone Encounter (Signed)
Patient needs office visit for refills.  Please schedule.

## 2023-04-07 NOTE — Telephone Encounter (Signed)
Spoke with patient and he stated that he will call back later to schedule due to him been busy.

## 2023-04-14 ENCOUNTER — Encounter: Payer: Medicaid Other | Admitting: Primary Care

## 2023-04-17 ENCOUNTER — Ambulatory Visit (INDEPENDENT_AMBULATORY_CARE_PROVIDER_SITE_OTHER): Payer: Medicaid Other | Admitting: Primary Care

## 2023-04-17 ENCOUNTER — Encounter: Payer: Self-pay | Admitting: Primary Care

## 2023-04-17 VITALS — BP 128/64 | HR 90 | Temp 97.1°F | Ht 70.5 in | Wt 204.0 lb

## 2023-04-17 DIAGNOSIS — Z1211 Encounter for screening for malignant neoplasm of colon: Secondary | ICD-10-CM

## 2023-04-17 DIAGNOSIS — I471 Supraventricular tachycardia, unspecified: Secondary | ICD-10-CM

## 2023-04-17 DIAGNOSIS — G8929 Other chronic pain: Secondary | ICD-10-CM | POA: Insufficient documentation

## 2023-04-17 DIAGNOSIS — Z0001 Encounter for general adult medical examination with abnormal findings: Secondary | ICD-10-CM

## 2023-04-17 DIAGNOSIS — E785 Hyperlipidemia, unspecified: Secondary | ICD-10-CM | POA: Diagnosis not present

## 2023-04-17 DIAGNOSIS — M545 Low back pain, unspecified: Secondary | ICD-10-CM | POA: Diagnosis not present

## 2023-04-17 DIAGNOSIS — N5319 Other ejaculatory dysfunction: Secondary | ICD-10-CM

## 2023-04-17 DIAGNOSIS — I456 Pre-excitation syndrome: Secondary | ICD-10-CM

## 2023-04-17 DIAGNOSIS — M542 Cervicalgia: Secondary | ICD-10-CM

## 2023-04-17 DIAGNOSIS — Z125 Encounter for screening for malignant neoplasm of prostate: Secondary | ICD-10-CM | POA: Diagnosis not present

## 2023-04-17 DIAGNOSIS — R6882 Decreased libido: Secondary | ICD-10-CM

## 2023-04-17 DIAGNOSIS — Z0000 Encounter for general adult medical examination without abnormal findings: Secondary | ICD-10-CM

## 2023-04-17 LAB — BASIC METABOLIC PANEL
BUN: 20 mg/dL (ref 6–23)
CO2: 26 meq/L (ref 19–32)
Calcium: 9.1 mg/dL (ref 8.4–10.5)
Chloride: 105 meq/L (ref 96–112)
Creatinine, Ser: 0.88 mg/dL (ref 0.40–1.50)
GFR: 91.71 mL/min (ref >60.00)
Glucose, Bld: 106 mg/dL — ABNORMAL HIGH (ref 70–99)
Potassium: 4.2 meq/L (ref 3.5–5.1)
Sodium: 139 meq/L (ref 135–145)

## 2023-04-17 LAB — LIPID PANEL
Cholesterol: 193 mg/dL (ref 0–200)
HDL: 38.2 mg/dL — ABNORMAL LOW (ref >39.00)
LDL Cholesterol: 124 mg/dL — ABNORMAL HIGH (ref 0–99)
NonHDL: 155.26
Total CHOL/HDL Ratio: 5
Triglycerides: 157 mg/dL — ABNORMAL HIGH (ref 0.0–149.0)
VLDL: 31.4 mg/dL (ref 0.0–40.0)

## 2023-04-17 LAB — HEMOGLOBIN A1C: Hgb A1c MFr Bld: 5.7 % (ref 4.6–6.5)

## 2023-04-17 LAB — PSA: PSA: 3.87 ng/mL (ref 0.10–4.00)

## 2023-04-17 MED ORDER — SILDENAFIL CITRATE 20 MG PO TABS: ORAL_TABLET | ORAL | 3 refills | Status: DC

## 2023-04-17 NOTE — Assessment & Plan Note (Signed)
Controlled.  Continue sildenafil 20 mg PRN.

## 2023-04-17 NOTE — Assessment & Plan Note (Signed)
Following with orthopedics. Pending surgery per Dr. Drucie Ip.

## 2023-04-17 NOTE — Assessment & Plan Note (Signed)
Repeat lipid panel pending.  Discussed the importance of a healthy diet and regular exercise in order for weight loss, and to reduce the risk of further co-morbidity.  

## 2023-04-17 NOTE — Assessment & Plan Note (Signed)
Immunizations UTD. Declines influenza vaccine.  Colonoscopy due per patient. Referral placed to GI. Cannot find prior colonoscopy in chart. PSA due and pending.  Discussed the importance of a healthy diet and regular exercise in order for weight loss, and to reduce the risk of further co-morbidity.  Exam stable. Labs pending.  Follow up in 1 year for repeat physical.

## 2023-04-17 NOTE — Patient Instructions (Signed)
Stop by the lab prior to leaving today. I will notify you of your results once received.   You will either be contacted via phone regarding your referral to GI for the colonoscopy, or you may receive a letter on your MyChart portal from our referral team with instructions for scheduling an appointment. Please let us know if you have not been contacted by anyone within two weeks.  It was a pleasure to see you today!   

## 2023-04-17 NOTE — Assessment & Plan Note (Signed)
Resolved.   S/P ablation per Dr. Ladona Ridgel in March 2024. Reviewed hospital notes from March 2024.

## 2023-04-17 NOTE — Assessment & Plan Note (Signed)
Following with electrophysiology, reviewed hospital notes from March 2024.

## 2023-04-17 NOTE — Progress Notes (Addendum)
Subjective:    Patient ID: Michael Farley, male    DOB: [birth year], 62 y.o.   MRN: [id number]  HPI  Michael Farley is a very pleasant 62 y.o. male who presents today for complete physical and follow up of chronic conditions.  Immunizations: -Tetanus: Completed in 2023 -Influenza: Declines influenza vaccine.  -Shingles: Never completed  Diet: Fair diet.  Exercise: No regular exercise, active at work.  Eye exam: Completes > 1 year ago Dental exam: Completes semi-annually  >1 year ago Colonoscopy: Completed about 10 years ago  PSA: Due    BP Readings from Last 3 Encounters:  04/17/23 128/64  01/15/23 (!) 190/103  11/25/22 (!) 167/82       Review of Systems  Constitutional:  Negative for unexpected weight change.  HENT:  Negative for rhinorrhea.   Respiratory:  Negative for cough and shortness of breath.   Cardiovascular:  Negative for chest pain.  Gastrointestinal:  Negative for constipation and diarrhea.  Genitourinary:  Negative for difficulty urinating.  Musculoskeletal:  Positive for arthralgias, back pain and myalgias.  Skin:  Negative for rash.  Allergic/Immunologic: Negative for environmental allergies.  Neurological:  Positive for numbness. Negative for dizziness and headaches.  Psychiatric/Behavioral:  The patient is not hyperactive.          Past Medical History:  Diagnosis Date  . Acute non-recurrent frontal sinusitis 08/19/2021  . Acute recurrent maxillary sinusitis 12/10/2021  . Back pain   . Kidney stones   . Wolff-Parkinson-White (WPW) syndrome     Social History   Socioeconomic History  . Marital status: Divorced    Spouse name: Not on file  . Number of children: Not on file  . Years of education: Not on file  . Highest education level: Not on file  Occupational History  . Not on file  Tobacco Use  . Smoking status: Former    Current packs/day: 0.00    Types: Cigarettes    Quit date: 1983    Years since quitting: 41.9     Passive exposure: Never  . Smokeless tobacco: Never  Vaping Use  . Vaping status: Never Used  Substance and Sexual Activity  . Alcohol use: Yes  . Drug use: Yes    Types: Marijuana    Comment: Rare  . Sexual activity: Not on file  Other Topics Concern  . Not on file  Social History Narrative   Single.   1 child.   Works as a Psychologist, sport and exercise.    Enjoys spending time with his girlfriend, spending time at the beach.    Social Determinants of Health   Financial Resource Strain: High Risk (06/14/2022)   Overall Financial Resource Strain (CARDIA)   . Difficulty of Paying Living Expenses: Hard  Food Insecurity: Food Insecurity Present (06/14/2022)   Hunger Vital Sign   . Worried About Programme researcher, broadcasting/film/video in the Last Year: Sometimes true   . Ran Out of Food in the Last Year: Sometimes true  Transportation Needs: No Transportation Needs (06/14/2022)   PRAPARE - Transportation   . Lack of Transportation (Medical): No   . Lack of Transportation (Non-Medical): No  Physical Activity: Not on file  Stress: Not on file  Social Connections: Not on file  Intimate Partner Violence: Not on file    Past Surgical History:  Procedure Laterality Date  . KNEE SURGERY Left 1985  . SVT ABLATION N/A 08/17/2022   Procedure: SVT ABLATION;  Surgeon: Marinus Maw, MD;  Location: [facility] INVASIVE CV  LAB;  Service: Cardiovascular;  Laterality: N/A;    Family History  Problem Relation Age of Onset  . COPD Mother   . Hypertension Mother   . Alcohol abuse Father   . Diabetes Father   . Cancer Maternal Grandmother   . Prostate cancer Maternal Uncle   . Bladder Cancer Neg Hx   . Kidney cancer Neg Hx     No Known Allergies  Current Outpatient Medications on File Prior to Visit  Medication Sig Dispense Refill  . cyclobenzaprine (FLEXERIL) 5 MG tablet Take 5 mg by mouth 2 (two) times daily.    . pregabalin (LYRICA) 75 MG capsule Take 75 mg by mouth 2 (two) times daily.     No current  facility-administered medications on file prior to visit.    BP 128/64   Pulse 90   Temp (!) 97.1 F (36.2 C) (Temporal)   Ht 5' 10.5" (1.791 m)   Wt 204 lb (92.5 kg)   SpO2 99%   BMI 28.86 kg/m  Objective:   Physical Exam HENT:     Right Ear: Tympanic membrane and ear canal normal.     Left Ear: Tympanic membrane and ear canal normal.  Eyes:     Pupils: Pupils are equal, round, and reactive to light.  Cardiovascular:     Rate and Rhythm: Normal rate and regular rhythm.  Pulmonary:     Effort: Pulmonary effort is normal.     Breath sounds: Normal breath sounds.  Abdominal:     General: Bowel sounds are normal.     Palpations: Abdomen is soft.     Tenderness: There is no abdominal tenderness.  Musculoskeletal:        General: Normal range of motion.     Cervical back: Neck supple.  Skin:    General: Skin is warm and dry.  Neurological:     Mental Status: He is alert and oriented to person, place, and time.     Cranial Nerves: No cranial nerve deficit.     Deep Tendon Reflexes:     Reflex Scores:      Patellar reflexes are 2+ on the right side and 2+ on the left side. Psychiatric:        Mood and Affect: Mood normal.          Assessment & Plan:  Preventative health care Assessment & Plan: Immunizations UTD. Declines influenza vaccine.  Colonoscopy due per patient. Referral placed to GI. Cannot find prior colonoscopy in chart. PSA due and pending.  Discussed the importance of a healthy diet and regular exercise in order for weight loss, and to reduce the risk of further co-morbidity.  Exam stable. Labs pending.  Follow up in 1 year for repeat physical.    SVT (supraventricular tachycardia) (HCC) Assessment & Plan: Resolved.   S/P ablation per Dr. Ladona Ridgel in March 2024. Reviewed hospital notes from March 2024.     Wolff-Parkinson-White (WPW) syndrome Assessment & Plan: Following with electrophysiology, reviewed hospital notes from March  2024.   Chronic right-sided low back pain without sciatica Assessment & Plan: Following with orthopedics, Dr. Drucie Ip with [facility]. Pending surgery for some sort of back surgery, he does not recall. No surgical clearance form today.  Will clear medically. He will need to see electrophysiology for cardiac clearance.    Abnormal ejaculation Assessment & Plan: Controlled.  Continue sildenafil 20 mg PRN.   Hyperlipidemia, unspecified hyperlipidemia type Assessment & Plan: Repeat lipid panel pending.  Discussed the importance  of a healthy diet and regular exercise in order for weight loss, and to reduce the risk of further co-morbidity.   Orders: -     Lipid panel -     Basic metabolic panel -     Hemoglobin A1c  Chronic neck pain Assessment & Plan: Following with orthopedics. Pending surgery per Dr. Drucie Ip.   Screening for colon cancer -     Ambulatory referral to Gastroenterology  Decreased sexual ability -     Sildenafil Citrate; TAKE 2-5 TABLETS BY MOUTH AS NEEDED 30  MINUTES  PRIOR  TO intercourse  Dispense: 50 tablet; Refill: 3  Screening for prostate cancer -     PSA        Doreene Nest, NP

## 2023-04-17 NOTE — Assessment & Plan Note (Signed)
Following with orthopedics, Dr. Drucie Ip with [facility]. Pending surgery for some sort of back surgery, he does not recall. No surgical clearance form today.  Will clear medically. He will need to see electrophysiology for cardiac clearance.

## 2023-05-19 ENCOUNTER — Telehealth: Payer: Self-pay | Admitting: Primary Care

## 2023-05-19 NOTE — Telephone Encounter (Signed)
I am happy to see him for this issue. He will need an office visit prior to a referral.  If he wants to try Flonase (fluticasone) nasal spray, 1 spray in each nostril BID, this can help with fluid build up in the ear.  He can find this over the counter at any drug store, [employer], [employer], etc.

## 2023-05-19 NOTE — Telephone Encounter (Signed)
Called and advised patient, offered patient appt, he declined at this time as he is going out of town and doesn't know his schedule for when he returns yet. Patient will call back to schedule appt.

## 2023-05-19 NOTE — Telephone Encounter (Signed)
Copied from CRM [id number]. Topic: Referral - Request for Referral >> May 19, 2023 11:36 AM Pascal Lux wrote: Did the patient discuss referral with their provider in the last year? No (If No - schedule appointment) - Patient is going out of town and would appreciate if Dr. Chestine Spore or her nurse call him back today regarding this matter. (If Yes - send message)  Appointment offered? Yes  Type of order/referral and detailed reason for visit:  Ear, Nose Throat specialist. Ear is not draining properly; requesting a referral to a specialist.  Preference of office, provider, location: N/A  If referral order, have you been seen by this specialty before? N/A (If Yes, this issue or another issue? When? Where?  Can we respond through MyChart? Yes

## 2023-06-08 ENCOUNTER — Other Ambulatory Visit: Payer: Self-pay

## 2023-06-08 ENCOUNTER — Other Ambulatory Visit (HOSPITAL_BASED_OUTPATIENT_CLINIC_OR_DEPARTMENT_OTHER): Payer: Self-pay

## 2023-06-08 ENCOUNTER — Encounter (HOSPITAL_BASED_OUTPATIENT_CLINIC_OR_DEPARTMENT_OTHER): Payer: Self-pay | Admitting: Emergency Medicine

## 2023-06-08 ENCOUNTER — Emergency Department (HOSPITAL_BASED_OUTPATIENT_CLINIC_OR_DEPARTMENT_OTHER)
Admission: EM | Admit: 2023-06-08 | Discharge: 2023-06-08 | Disposition: A | Discharge status: Home / Self Care | Payer: Medicaid Other | Attending: Emergency Medicine | Admitting: Emergency Medicine

## 2023-06-08 DIAGNOSIS — H66006 Acute suppurative otitis media without spontaneous rupture of ear drum, recurrent, bilateral: Secondary | ICD-10-CM | POA: Diagnosis not present

## 2023-06-08 DIAGNOSIS — H66003 Acute suppurative otitis media without spontaneous rupture of ear drum, bilateral: Secondary | ICD-10-CM

## 2023-06-08 DIAGNOSIS — H579 Unspecified disorder of eye and adnexa: Secondary | ICD-10-CM | POA: Diagnosis present

## 2023-06-08 MED ORDER — AMOXICILLIN-POT CLAVULANATE 875-125 MG PO TABS
1.0000 | ORAL_TABLET | Freq: Two times a day (BID) | ORAL | 0 refills | Status: DC
  Filled 2023-06-08: qty 14, 7d supply, fill #0

## 2023-06-08 NOTE — Discharge Instructions (Signed)
The antibiotics as prescribed.  Follow-up with your primary care doctor and/or Dr Ernestene Kiel, ENT as we discussed

## 2023-06-08 NOTE — ED Notes (Signed)
Pt discharged home after verbalizing understanding of discharge instructions; nad noted. 

## 2023-06-08 NOTE — ED Provider Notes (Signed)
[place] EMERGENCY DEPARTMENT AT [facility] Provider Note   CSN: [id number] Arrival date & time: 06/08/23  1049     History  Chief Complaint  Patient presents with   Ear Drainage    Michael Farley is a 63 y.o. male.   Ear Drainage   Patient has a history of sinusitis as well as kidney stones and back pain.  Patient states he has been having trouble with ear drainage and congestion for several months.  Patient states he wakes up in the morning and knows some crusting in his ears.  He has a primary care doctor but has not seen them for this issue.  Patient states he tried using hydrogen peroxide drops in his ears but the symptoms have persisted.  He occasionally has some vertigo symptoms as well but nothing acute with that today.    Home Medications Prior to Admission medications   Medication Sig Start Date End Date Taking? Authorizing Provider  amoxicillin-clavulanate (AUGMENTIN) 875-125 MG tablet Take 1 tablet by mouth every 12 (twelve) hours. 06/08/23  Yes Linwood Dibbles, MD  cyclobenzaprine (FLEXERIL) 5 MG tablet Take 5 mg by mouth 2 (two) times daily. 04/02/23   [provider]  pregabalin (LYRICA) 75 MG capsule Take 75 mg by mouth 2 (two) times daily.    [provider]  sildenafil (REVATIO) 20 MG tablet TAKE 2-5 TABLETS BY MOUTH AS NEEDED 30  MINUTES  PRIOR  TO intercourse 04/17/23   Doreene Nest, NP      Allergies    Patient has no known allergies.    Review of Systems   Review of Systems  Physical Exam Updated Vital Signs BP 123/82 (BP Location: Right Arm)   Pulse 87   Temp 98.2 F (36.8 C) (Oral)   Resp 18   Ht 1.778 m (5\' 10" )   Wt 90.7 kg   SpO2 99%   BMI 28.70 kg/m  Physical Exam Vitals and nursing note reviewed.  Constitutional:      General: He is not in acute distress.    Appearance: He is well-developed.  HENT:     Head: Normocephalic and atraumatic.     Right Ear: Ear canal and external ear normal. A middle  ear effusion is present. There is no impacted cerumen. Tympanic membrane is bulging.     Left Ear: Ear canal and external ear normal. A middle ear effusion is present. There is no impacted cerumen. Tympanic membrane is bulging.     Nose: Congestion present.     Mouth/Throat:     Mouth: Mucous membranes are moist.     Pharynx: No posterior oropharyngeal erythema.  Eyes:     General: No scleral icterus.       Right eye: No discharge.        Left eye: No discharge.     Conjunctiva/sclera: Conjunctivae normal.  Neck:     Trachea: No tracheal deviation.  Cardiovascular:     Rate and Rhythm: Normal rate.  Pulmonary:     Effort: Pulmonary effort is normal. No respiratory distress.     Breath sounds: No stridor.  Abdominal:     General: There is no distension.  Musculoskeletal:        General: No swelling or deformity.     Cervical back: Neck supple.  Skin:    General: Skin is warm and dry.     Findings: No rash.  Neurological:     Mental Status: He is alert. Mental status is  at baseline.     Cranial Nerves: No dysarthria or facial asymmetry.     Motor: No seizure activity.     ED Results / Procedures / Treatments   Labs (all labs ordered are listed, but only abnormal results are displayed) Labs Reviewed - No data to display  EKG None  Radiology No results found.  Procedures Procedures    Medications Ordered in ED Medications - No data to display  ED Course/ Medical Decision Making/ A&P                                 Medical Decision Making Problems Addressed: Acute suppurative otitis media of both ears without spontaneous rupture of tympanic membranes, recurrence not specified: acute illness or injury that poses a threat to life or bodily functions  Risk Prescription drug management.   Patient's exam is suggestive of otitis media.  He has evidence of effusion in his middle ear.  Fluid appears mucopurulent.  Will start the patient on a course of antibiotics.   Recommend outpatient follow-up with ENT to be rechecked.        Final Clinical Impression(s) / ED Diagnoses Final diagnoses:  Acute suppurative otitis media of both ears without spontaneous rupture of tympanic membranes, recurrence not specified    Rx / DC Orders ED Discharge Orders          Ordered    amoxicillin-clavulanate (AUGMENTIN) 875-125 MG tablet  Every 12 hours        06/08/23 1307              Linwood Dibbles, MD 06/08/23 1307

## 2023-06-08 NOTE — ED Notes (Addendum)
Pt from home with ear drainage x 3 months. States he wakes up every morning with wet in his ears that crusts up and causes jaw issues. Pt states his pcp is too far away. States that it improved some for a while when treated with hydrogen peroxide but has gotten worse. Pt states he is unable to afford medications. States he has vertigo symptoms as well, has been taking meclizine. Pt alert & oriented, nad noted.

## 2023-06-08 NOTE — ED Triage Notes (Signed)
C/o R ear drainage x 2 months. Reports increased drainage and ear pain.

## 2023-06-28 ENCOUNTER — Encounter: Payer: Self-pay | Admitting: Family Medicine

## 2023-06-28 ENCOUNTER — Ambulatory Visit: Payer: Medicaid Other | Admitting: Family Medicine

## 2023-06-28 VITALS — BP 120/60 | HR 87 | Temp 98.1°F | Ht 70.0 in | Wt 201.0 lb

## 2023-06-28 DIAGNOSIS — Z7689 Persons encountering health services in other specified circumstances: Secondary | ICD-10-CM

## 2023-06-28 DIAGNOSIS — R7303 Prediabetes: Secondary | ICD-10-CM

## 2023-06-28 DIAGNOSIS — Z23 Encounter for immunization: Secondary | ICD-10-CM

## 2023-06-28 DIAGNOSIS — E782 Mixed hyperlipidemia: Secondary | ICD-10-CM

## 2023-06-28 DIAGNOSIS — Z1211 Encounter for screening for malignant neoplasm of colon: Secondary | ICD-10-CM | POA: Diagnosis not present

## 2023-06-28 NOTE — Progress Notes (Signed)
 I,Jameka J Llittleton, CMA,acting as a neurosurgeon for [employer], NP.,have documented all relevant documentation on the behalf of Bruna Creighton, NP,as directed by  Bruna Creighton, NP while in the presence of Bruna Creighton, NP.  Subjective:  Patient ID: Michael Farley , male    DOB: 1961/02/18 , 63 y.o.   MRN: [id number]  Chief Complaint  Patient presents with   Establish Care   Prediabetes   Hyperlipidemia    HPI  Patient is a 63 year old male who presents today to establish care. Patient reports he has a diagnosis of prediabetes and hypercholesteremia. He is not currently on any medication because he wanted to try diet and exercise first.  Patient reports that he currently sees a Neurosurgeon at [facility], Dr Debby for Cervical Radiculopathy. He wants his labs checked today to see how he is doing.     Past Medical History:  Diagnosis Date   Acute non-recurrent frontal sinusitis 08/19/2021   Acute recurrent maxillary sinusitis 12/10/2021   Back pain    Kidney stones    Wolff-Parkinson-White (WPW) syndrome      Family History  Problem Relation Age of Onset   COPD Mother    Hypertension Mother    Alcohol abuse Father    Diabetes Father    Cancer Maternal Grandmother    Prostate cancer Maternal Uncle    Bladder Cancer Neg Hx    Kidney cancer Neg Hx      Current Outpatient Medications:    cyclobenzaprine  (FLEXERIL ) 5 MG tablet, Take 5 mg by mouth 2 (two) times daily., Disp: , Rfl:    HYDROcodone -acetaminophen  (NORCO/VICODIN) 5-325 MG tablet, Take 1 tablet by mouth 2 (two) times daily as needed., Disp: , Rfl:    pregabalin (LYRICA) 75 MG capsule, Take 75 mg by mouth 3 (three) times daily., Disp: , Rfl:    sildenafil  (REVATIO ) 20 MG tablet, TAKE 2-5 TABLETS BY MOUTH AS NEEDED 30  MINUTES  PRIOR  TO intercourse, Disp: 50 tablet, Rfl: 3   No Known Allergies   Review of Systems  Constitutional: Negative.   Eyes: Negative.   Cardiovascular: Negative.    Musculoskeletal:  Positive for arthralgias.       Right knee pain  Skin: Negative.   Psychiatric/Behavioral: Negative.       Today's Vitals   06/28/23 1548  BP: 120/60  Pulse: 87  Temp: 98.1 F (36.7 C)  TempSrc: Oral  Weight: 201 lb (91.2 kg)  Height: 5' 10 (1.778 m)  PainSc: 4    Body mass index is 28.84 kg/m.  Wt Readings from Last 3 Encounters:  06/28/23 201 lb (91.2 kg)  06/08/23 200 lb (90.7 kg)  04/17/23 204 lb (92.5 kg)    The 10-year ASCVD risk score (Arnett DK, et al., 2019) is: 13.1%   Values used to calculate the score:     Age: 69 years     Sex: Male     Is Non-Hispanic African American: No     Diabetic: No     Tobacco smoker: No     Systolic Blood Pressure: 120 mmHg     Is BP treated: No     HDL Cholesterol: 45 mg/dL     Total Cholesterol: 268 mg/dL  Objective:  Physical Exam HENT:     Head: Normocephalic.  Cardiovascular:     Rate and Rhythm: Normal rate and regular rhythm.  Pulmonary:     Effort: Pulmonary effort is normal.  Musculoskeletal:  Cervical back: Tenderness present.  Neurological:     Mental Status: He is alert.         Assessment And Plan:  Establishing care with new doctor, encounter for  Prediabetes Assessment & Plan: Low carb diet and exercise.  Orders: -     CBC -     CMP14+EGFR -     Hemoglobin A1c  Mixed hyperlipidemia Assessment & Plan: Low fat diet and exercise. Does not want medications.  Orders: -     Lipid panel  Screening for colon cancer -     Ambulatory referral to Gastroenterology  Immunization due -     Varicella-zoster vaccine IM    Return for Follow up physical 04/2024.  Patient was given opportunity to ask questions. Patient verbalized understanding of the plan and was able to repeat key elements of the plan. All questions were answered to their satisfaction.    I, Bruna Creighton, NP, have reviewed all documentation for this visit. The documentation on 07/05/23 for the exam, diagnosis,  procedures, and orders are all accurate and complete.   IF YOU HAVE BEEN REFERRED TO A SPECIALIST, IT MAY TAKE 1-2 WEEKS TO SCHEDULE/PROCESS THE REFERRAL. IF YOU HAVE NOT HEARD FROM US /SPECIALIST IN TWO WEEKS, PLEASE GIVE US  A CALL AT [phone].

## 2023-06-29 LAB — CMP14+EGFR
ALT: 25 [IU]/L (ref 0–44)
AST: 29 [IU]/L (ref 0–40)
Albumin: 4.5 g/dL (ref 3.9–4.9)
Alkaline Phosphatase: 84 [IU]/L (ref 44–121)
BUN/Creatinine Ratio: 22 (ref 10–24)
BUN: 19 mg/dL (ref 8–27)
Bilirubin Total: 0.4 mg/dL (ref 0.0–1.2)
CO2: 24 mmol/L (ref 20–29)
Calcium: 9.8 mg/dL (ref 8.6–10.2)
Chloride: 101 mmol/L (ref 96–106)
Creatinine, Ser: 0.88 mg/dL (ref 0.76–1.27)
Globulin, Total: 2.3 g/dL (ref 1.5–4.5)
Glucose: 86 mg/dL (ref 70–99)
Potassium: 5.3 mmol/L — ABNORMAL HIGH (ref 3.5–5.2)
Sodium: 141 mmol/L (ref 134–144)
Total Protein: 6.8 g/dL (ref 6.0–8.5)
eGFR: 97 mL/min/{1.73_m2} (ref >59)

## 2023-06-29 LAB — LIPID PANEL
Chol/HDL Ratio: 6 {ratio} — ABNORMAL HIGH (ref 0.0–5.0)
Cholesterol, Total: 268 mg/dL — ABNORMAL HIGH (ref 100–199)
HDL: 45 mg/dL (ref >39)
LDL Chol Calc (NIH): 169 mg/dL — ABNORMAL HIGH (ref 0–99)
Triglycerides: 287 mg/dL — ABNORMAL HIGH (ref 0–149)
VLDL Cholesterol Cal: 54 mg/dL — ABNORMAL HIGH (ref 5–40)

## 2023-06-29 LAB — CBC
Hematocrit: 47.6 % (ref 37.5–51.0)
Hemoglobin: 15.9 g/dL (ref 13.0–17.7)
MCH: 28.1 pg (ref 26.6–33.0)
MCHC: 33.4 g/dL (ref 31.5–35.7)
MCV: 84 fL (ref 79–97)
Platelets: 314 10*3/uL (ref 150–450)
RBC: 5.66 x10E6/uL (ref 4.14–5.80)
RDW: 13.1 % (ref 11.6–15.4)
WBC: 10 10*3/uL (ref 3.4–10.8)

## 2023-06-29 LAB — HEMOGLOBIN A1C
Est. average glucose Bld gHb Est-mCnc: 123 mg/dL
Hgb A1c MFr Bld: 5.9 % — ABNORMAL HIGH (ref 4.8–5.6)

## 2023-07-05 ENCOUNTER — Encounter: Payer: Self-pay | Admitting: Family Medicine

## 2023-07-05 MED ORDER — ATORVASTATIN CALCIUM 40 MG PO TABS: 40.0000 mg | ORAL_TABLET | Freq: Every day | ORAL | 11 refills | Status: DC

## 2023-07-05 NOTE — Progress Notes (Signed)
 A1c is 5.9; that make you a prediabetic. Your cholesterol levels are very elevated. I would recommend starting a cholesterol lowering medication and also a cardiac CT scoring to make sure that the veins supplying blood to your heart is not clogged with cholesterol. Atorvastatin  40 mg once daily has been sent to the pharmacy.  Let me know what you think.  Thanks,  Bruna

## 2023-07-05 NOTE — Assessment & Plan Note (Signed)
Low carb diet and exercise.

## 2023-07-05 NOTE — Assessment & Plan Note (Signed)
Low fat diet and exercise. Does not want medications.

## 2023-08-09 ENCOUNTER — Telehealth: Payer: Self-pay

## 2023-08-09 ENCOUNTER — Encounter: Payer: Self-pay | Admitting: Gastroenterology

## 2023-08-09 NOTE — Telephone Encounter (Signed)
    Primary Cardiologist: Dr. Ladona Ridgel  Chart reviewed as part of pre-operative protocol coverage. Because of Kaycen Whitworth past medical history and time since last visit, he/she will require a follow-up visit in order to better assess preoperative cardiovascular risk.  Pre-op covering staff: - Please schedule an office appointment and call patient to inform them. - Please contact requesting surgeon's office via preferred method (i.e, phone, fax) to inform them of need for appointment prior to surgery.  If applicable, this message will also be routed to pharmacy pool and/or primary cardiologist for input on holding anticoagulant/antiplatelet agent as requested below so that this information is available at time of patient's appointment.   Ronney Asters, NP  08/09/2023, 8:36 AM

## 2023-08-09 NOTE — Telephone Encounter (Signed)
 Will update all parties involved pt has appt 08/18/23 with Canary Brim, NP.

## 2023-08-09 NOTE — Telephone Encounter (Signed)
   Pre-operative Risk Assessment    Patient Name: Michael Farley  DOB: 1961/04/16 MRN: [id number]   Date of last office visit: 06/10/22 RENEE URSUY, PA-C Date of next office visit: NONE   Request for Surgical Clearance    Procedure:   ACDF- C6- C7  Date of Surgery:  Clearance TBD                                Surgeon:  Bedelia Person, MD Surgeon's Group or Practice Name:  [facility] Phone number:  [phone] EXT [phone] Fax number:  [phone] ATTN ANGIE   Type of Clearance Requested:   - Medical    Type of Anesthesia:  General    Additional requests/questions:    SignedMarlow Baars   08/09/2023, 8:17 AM

## 2023-08-09 NOTE — Telephone Encounter (Signed)
 Will send message to EP scheduler to reach out to pt with an appt for preop clearance.

## 2023-08-16 ENCOUNTER — Ambulatory Visit (AMBULATORY_SURGERY_CENTER)

## 2023-08-16 VITALS — Ht 70.0 in | Wt 205.0 lb

## 2023-08-16 DIAGNOSIS — Z1211 Encounter for screening for malignant neoplasm of colon: Secondary | ICD-10-CM

## 2023-08-16 NOTE — Progress Notes (Signed)
 No egg or soy allergy known to patient  No issues known to pt with past sedation with any surgeries or procedures Patient denies ever being told they had issues or difficulty with intubation  No FH of Malignant Hyperthermia Pt is not on diet pills Pt is not on  home 02  Pt is not on blood thinners  Pt denies issues with constipation  No A fib or A flutter Have any cardiac testing pending--No Pt can ambulate  Pt denies use of chewing tobacco Discussed diabetic I weight loss medication holds Discussed NSAID holds Checked BMI Pt instructed to use Single[employer] or GoodRx for a price reduction on prep  Pre visit completed   After pre-visit was completed, patient states he refuses to bring a care partner with him to the colonoscopy.  States he only wants to come by himself, does not want anyone driving him or waiting in the waiting area.  RN informed patient it is [facility] policy to have a care partner with him or they will not do the procedure.  He requests the colonoscopy be cancelled.  Colonoscopy cancelled.

## 2023-08-17 NOTE — Progress Notes (Unsigned)
 Electrophysiology Office Note:   Date:  08/18/2023  ID:  Tyrees Chopin, DOB 10-05-1960, MRN [id number]  Primary Cardiologist: None Primary Heart Failure: None Electrophysiologist: None      History of Present Illness:   Torell Minder is a 63 y.o. male with h/o WPW, HLD, pre-DM, back pain seen today for cardiac clearance for cervical spine surgery & routine follow up.    Since last being seen in our clinic the patient reports doing very well. He feels he is "as strong as an ox".  Has had social stressors with a break up. Denies exertional shortness of breath or chest pain. No issues with fast rhythms or palpitations post ablation.   He denies chest pain, palpitations, dyspnea, PND, orthopnea, nausea, vomiting, dizziness, syncope, edema, weight gain, or early satiety.  Review of systems complete and found to be negative unless listed in HPI.   EP Information / Studies Reviewed:    EKG is ordered today. Personal review as below.  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 after SVT ablation and it was successful ablated as well, successful RF modification of the slow AV nodal pathway and right free wall AP   Arrhythmia / AAD WPW s/p ablation           Physical Exam:   VS:  BP 112/64   Pulse 81   Ht 5\' 10"  (1.778 m)   Wt 203 lb (92.1 kg)   SpO2 97%   BMI 29.13 kg/m    Wt Readings from Last 3 Encounters:  08/18/23 203 lb (92.1 kg)  08/16/23 205 lb (93 kg)  06/28/23 201 lb (91.2 kg)     GEN: Well nourished,  well developed in no acute distress NECK: No JVD; No carotid bruits CARDIAC: Regular rate and rhythm, no murmurs, rubs, gallops RESPIRATORY:  Clear to auscultation without rales, wheezing or rhonchi  ABDOMEN: Soft, non-tender, non-distended EXTREMITIES:  No edema; No deformity   ASSESSMENT AND PLAN:    WPW  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 Jani perioperative risk of a major cardiac event is 0.4% according to the Revised Cardiac Risk Index (RCRI).  Therefore, he is at low risk for perioperative complications.     Recommendations: According to ACC/AHA guidelines, no further cardiovascular testing needed.  The patient may proceed to surgery at acceptable risk.     Follow up with Dr. Ladona Ridgel in 6 months  Signed, Canary Brim, NP-C, AGACNP-BC [facility] -  Electrophysiology  08/18/2023, 6:13 PM

## 2023-08-18 ENCOUNTER — Encounter: Payer: Self-pay | Admitting: Pulmonary Disease

## 2023-08-18 ENCOUNTER — Ambulatory Visit: Attending: Pulmonary Disease | Admitting: Pulmonary Disease

## 2023-08-18 VITALS — BP 112/64 | HR 81 | Ht 70.0 in | Wt 203.0 lb

## 2023-08-18 DIAGNOSIS — I456 Pre-excitation syndrome: Secondary | ICD-10-CM | POA: Diagnosis not present

## 2023-08-18 DIAGNOSIS — Z01810 Encounter for preprocedural cardiovascular examination: Secondary | ICD-10-CM

## 2023-08-18 DIAGNOSIS — I471 Supraventricular tachycardia, unspecified: Secondary | ICD-10-CM | POA: Diagnosis not present

## 2023-08-18 DIAGNOSIS — Z01818 Encounter for other preprocedural examination: Secondary | ICD-10-CM

## 2023-08-18 NOTE — Patient Instructions (Signed)
 Medication Instructions:  Your physician recommends that you continue on your current medications as directed. Please refer to the Current Medication list given to you today.  *If you need a refill on your cardiac medications before your next appointment, please call your pharmacy*  Lab Work: None ordered If you have labs (blood work) drawn today and your tests are completely normal, you will receive your results only by: MyChart Message (if you have MyChart) OR A paper copy in the mail If you have any lab test that is abnormal or we need to change your treatment, we will call you to review the results.  Follow-Up: At [facility], you and your health needs are our priority.  As part of our continuing mission to provide you with exceptional heart care, our providers are all part of one team.  This team includes your primary Cardiologist (physician) and Advanced Practice Providers or APPs (Physician Assistants and Nurse Practitioners) who all work together to provide you with the care you need, when you need it.  Your next appointment:   6 month(s)  Provider:   Canary Brim, NP    We recommend signing up for the patient portal called "MyChart".  Sign up information is provided on this After Visit Summary.  MyChart is used to connect with patients for Virtual Visits (Telemedicine).  Patients are able to view lab/test results, encounter notes, upcoming appointments, etc.  Non-urgent messages can be sent to your provider as well.   To learn more about what you can do with MyChart, go to [email or web address].      1st Floor: - Lobby - Registration  - Pharmacy  - Lab - Cafe  2nd Floor: - PV Lab - Diagnostic Testing (echo, CT, nuclear med)  3rd Floor: - Vacant  4th Floor: - TCTS (cardiothoracic surgery) - AFib Clinic - Structural Heart Clinic - Vascular Surgery  - Vascular Ultrasound  5th Floor: - HeartCare Cardiology (general and EP) - Clinical Pharmacy for  coumadin, hypertension, lipid, weight-loss medications, and med management appointments    Valet parking services will be available as well.

## 2023-09-13 ENCOUNTER — Encounter: Admitting: Gastroenterology

## 2024-01-25 ENCOUNTER — Encounter: Payer: Self-pay | Admitting: Pulmonary Disease

## 2024-02-05 NOTE — Progress Notes (Signed)
 [facility] - [place]  [address], [address] [place], [place] [address] [phone]  Audiology Note  Patient Name: Michael Farley    Patient DOB: [birth year]             Patient Age: 63 y.o.       [name] was seen for a tympanograms following the kind referral of Velia Pry, PA-C.  He was scheduled for a Trenda Craze, but at his appointment check in, he was reported the need for healthcare related to drainage.  The patient has concerns with current otorrhea.  Type B and Type C tympanograms were obtained for the right and left ear respectively.  Additionally, the vertebral artery screening test was negative to both sides.  Electronically signed by: Philippe Gowda Meroth, Au.D. 02/05/2024 3:17 PM Clinical Audiologist for [facility], Nose, and Throat

## 2024-02-05 NOTE — Progress Notes (Signed)
 Otolaryngology Clinic Note  HPI:    Cc: ears draining  Michael Farley is a 63 y.o. male who presents as an established patient for ear drainage. He was last seen on 01/15/24 for follow up. He had been previously treated with topical Clotrimazole drops for left-sided chronic mycotic otitis externa. His ear exam was normal at last visit following resolution of the infection. 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 Type C tympanometry on the left and type A on the right (last tested February 2025). He continues to use Flonase. He is interested in possible tympanostomy tube placement for ETD and his hearing loss.   Patient was scheduled today for vestibular testing, however this will be deferred due to his report of draining ears. He states his dizziness has improved. For the past 3 days the right ear in particular has been draining. He does use Q-tips.   Denies fever, otalgia, tinnitus, headache, acute vision changes, syncope, slurred speech or changes in mental status.   No history of ear surgeries.    Non-smoker.   PMH/Meds/All/SocHx/FamHx/ROS:   Medical History[1]  Surgical History[2]  No family history of bleeding disorders, wound healing problems or difficulty with anesthesia.      Current Medications[3]  A complete ROS was performed with pertinent positives/negatives noted in the HPI. The remainder of the ROS are negative.    Physical Exam:    There were no vitals taken for this visit.   General Awake, at baseline alertness during examination.  Eyes No scleral icterus or conjunctival hemorrhage. Globe position appears normal. EOMI.   Right Ear EAC with mucopurulent plug deep in canal overlying the surface of the TM with superficial fungal elements.  Left Ear EAC patent, TM intact w/o inflammation. Middle ear well aerated. There is superficial fungal elements along the floor  of the ear canal and posterior surface of the TM.  Nose Patent, no polyps or masses seen on anterior rhinoscopy.  Oral cavity No mucosal lesions or tumors seen. Tongue midline.   Oropharynx Symmetric tonsils.   Neck No abnormal cervical lymphadenopathy. No thyromegaly. No thyroid masses palpated.   Independent Review of Additional Tests or Records:  Medical records, Audiometry.  Procedures:   Procedure Note - Ear Microscopy, Bilateral:  Risks/benefits and alternatives were discussed with patient who understands and agrees to proceed.  DETAILS OF PROCEDURE: The patient was positioned and the ear was examined with the microscope. Mucopurulent plug removed using 5-0 suction from the right ear canal. Right TM is intact, middle ear aerated. Instilled acetic acid solution and suctioned this out followed by Nystatin powder.  For the left ear, instilled acetic acid solution and suctioned this out using 3-0 suction along with fungal elements. Left TM is intact, slightly retracted with aerated middle ear. Nystatin powder instilled.  The patient tolerated this well.  No complications.  Impression & Plans:  Michael Farley is a 63 y.o. male with bilateral mycotic otitis externa, treated today with debridement and topical Nystatin powder. I recommend Clotrimazole 1% topical solution, 4 drops twice daily in both ears for 14 days. Strict water precautions advised along with avoidance of Q-tips. Follow up in 2 weeks. Once the infection is resolved we will repeat tympanometry. If the left ear is maintaining a type C tymp, we will consider placement of a tympanostomy tube with one of my colleagues for ETD and mixed hearing loss.  Patient agrees with the plan.  Velia Pry, PA-C [facility]       [1] No past medical history on file. [2] No past surgical history on file. [3]  Current Outpatient Medications:  .  ciprofloxacin (CILOXAN) 0.3 % ophthalmic solution, Instill 3 drops into both  ears twice daily for 10 days., Disp: 10 mL, Rfl: 1 .  ciprofloxacin (CILOXAN) 0.3 % ophthalmic solution, Instill 3 drops twice daily into affected ear(s) for 7 days., Disp: 10 mL, Rfl: 1 .  clotrimazole (LOTRIMIN) 1 % soln, Instill 4 drops into the left ear twice daily for 14 days., Disp: 30 mL, Rfl: 1 .  cyclobenzaprine  (FLEXERIL ) 5 mg tablet, Take 5 mg by mouth., Disp: , Rfl:  .  dexAMETHasone sodium phosphate (DECADRON) 0.1 % ophthalmic solution, Instill 2 drops into both ears twice daily for 10 days., Disp: 5 mL, Rfl: 1 .  dexAMETHasone sodium phosphate (DECADRON) 0.1 % ophthalmic solution, Instill 2 drops twice daily into affected ear(s) for 7 days., Disp: 5 mL, Rfl: 1 .  fluticasone propionate (FLONASE) 50 mcg/spray nasal spray, Instill 2 puffs each nostril every night., Disp: 16 g, Rfl: 3 .  fluticasone propionate (FLONASE) 50 mcg/spray nasal spray, Instill 2 puffs each nostril daily., Disp: 16 g, Rfl: 11 .  HYDROcodone -acetaminophen  (NORCO) 5-325 mg per tablet, Take 1 tablet by mouth., Disp: , Rfl:  .  pregabalin (LYRICA) 75 mg capsule, Take 75 mg by mouth daily., Disp: , Rfl:  .  sildenafiL  (REVATIO ) 20 mg tablet, TAKE 2 TO 5 TABLETS BY MOUTH AS NEEDED 30 MINUTES PRIOR TO INTERCOURSE, Disp: , Rfl:

## 2024-03-02 ENCOUNTER — Other Ambulatory Visit: Payer: Self-pay

## 2024-03-02 ENCOUNTER — Ambulatory Visit (HOSPITAL_COMMUNITY)
Admission: RE | Admit: 2024-03-02 | Discharge: 2024-03-02 | Disposition: A | Discharge status: Home / Self Care | Source: Ambulatory Visit | Attending: Emergency Medicine | Admitting: Emergency Medicine

## 2024-03-02 ENCOUNTER — Encounter (HOSPITAL_COMMUNITY): Payer: Self-pay

## 2024-03-02 VITALS — BP 146/81 | HR 82 | Temp 98.1°F | Resp 20

## 2024-03-02 DIAGNOSIS — H66003 Acute suppurative otitis media without spontaneous rupture of ear drum, bilateral: Secondary | ICD-10-CM

## 2024-03-02 DIAGNOSIS — H60393 Other infective otitis externa, bilateral: Secondary | ICD-10-CM

## 2024-03-02 MED ORDER — CIPROFLOXACIN-DEXAMETHASONE 0.3-0.1 % OT SUSP: 4.0000 [drp] | Freq: Two times a day (BID) | OTIC | 0 refills | Status: DC

## 2024-03-02 MED ORDER — AMOXICILLIN-POT CLAVULANATE 875-125 MG PO TABS: 1.0000 | ORAL_TABLET | Freq: Two times a day (BID) | ORAL | 0 refills | Status: DC

## 2024-03-02 NOTE — ED Triage Notes (Signed)
 PT reports decreased hearing worse on RT that Lt. Pt has has years of decreased hearing. Pt feels he has water in inner ears. Pt doe don want to develop Vertigo . Pt has had vomiting with vertigo in the past. Pt has been to ENT for ears.

## 2024-03-02 NOTE — Discharge Instructions (Signed)
 Start taking Augmentin  twice daily for 7 days for ear infection. Also apply 4 drops of Ciprodex eardrops 2 times daily to both ears for additional infection coverage. You can follow-up with Dr. Karis who is an ear nose and throat doctor for further evaluation and management if needed. Otherwise follow-up with your primary care provider or return here as needed.

## 2024-03-02 NOTE — ED Provider Notes (Signed)
 [facility]    CSN: [id number] Arrival date & time: 03/02/24  1304      History   Chief Complaint Chief Complaint  Patient presents with   decrease hearing    HPI Larron Armor is a 63 y.o. male.   Patient presents with difficulty hearing, sensation of fluid in his ears and noticing some fluid draining from his ears over the last few days.  Patient states that he also feels like the fluid is draining down the back of his throat and therefore causing him to have a mildly sore throat.  Patient states that at times he also has a cough with this.  Patient states that at times he has also felt hot and cold, but is unsure if he has had a fever.  Denies body aches, chills, chest pain, shortness of breath, nausea, vomiting, diarrhea, and abdominal pain.   Patient has had ongoing issues with his ears over the years and has seen ENT and both times over the last 2 months.  Patient states that he does not take any daily medications to help with his symptoms.  Patient states that he has tried to use nasal spray without relief.  Patient denies any recent antibiotic use.  The history is provided by the patient and medical records.    Past Medical History:  Diagnosis Date   Acute non-recurrent frontal sinusitis 08/19/2021   Acute recurrent maxillary sinusitis 12/10/2021   Back pain    Hyperlipidemia    Kidney stones    Wolff-Parkinson-White (WPW) syndrome     Patient Active Problem List   Diagnosis Date Noted   Prediabetes 06/28/2023   Establishing care with new doctor, encounter for 06/28/2023   Chronic neck pain 04/17/2023   Wolff-Parkinson-White (WPW) syndrome 12/01/2021   Benign paroxysmal positional vertigo due to bilateral vestibular disorder 08/19/2021   Hemorrhoids, internal 08/19/2021   Chronic allergic rhinitis 08/19/2021   SVT (supraventricular tachycardia) 03/31/2021   Anxiety and depression 04/22/2020   Abnormal ejaculation 04/22/2020   Hyperlipidemia  07/13/2018   Decreased sexual ability 03/31/2016   Preventative health care 03/31/2016   History of kidney stones 03/31/2016   Chronic back pain 03/31/2016    Past Surgical History:  Procedure Laterality Date   KNEE SURGERY Left 1985   SVT ABLATION N/A 08/17/2022   Procedure: SVT ABLATION;  Surgeon: Waddell Danelle ORN, MD;  Location: [facility];  Service: Cardiovascular;  Laterality: N/A;       Home Medications    Prior to Admission medications   Medication Sig Start Date End Date Taking? Authorizing Provider  amoxicillin -clavulanate (AUGMENTIN ) 875-125 MG tablet Take 1 tablet by mouth every 12 (twelve) hours. 03/02/24  Yes Johnie, Yazmin Locher A, NP  ciprofloxacin-dexamethasone (CIPRODEX) OTIC suspension Place 4 drops into both ears 2 (two) times daily. 03/02/24  Yes Lawan Nanez A, NP  cyclobenzaprine  (FLEXERIL ) 5 MG tablet Take 5 mg by mouth 2 (two) times daily. 04/02/23  Yes [provider]  fluticasone (FLONASE) 50 MCG/ACT nasal spray Place 1 spray into both nostrils daily. 07/04/23  Yes [provider]  HYDROcodone -acetaminophen  (NORCO/VICODIN) 5-325 MG tablet Take 1 tablet by mouth 2 (two) times daily as needed.   Yes [provider]  pregabalin (LYRICA) 75 MG capsule Take 75 mg by mouth 3 (three) times daily.   Yes [provider]  sildenafil  (REVATIO ) 20 MG tablet TAKE 2-5 TABLETS BY MOUTH AS NEEDED 30  MINUTES  PRIOR  TO intercourse 04/17/23   Gretta Crank  K, NP    Family History Family History  Problem Relation Age of Onset   COPD Mother    Hypertension Mother    Alcohol abuse Father    Diabetes Father    Prostate cancer Maternal Uncle    Stomach cancer Maternal Grandmother    Cancer Maternal Grandmother    Bladder Cancer Neg Hx    Kidney cancer Neg Hx    Colon cancer Neg Hx    Rectal cancer Neg Hx    Esophageal cancer Neg Hx     Social History Social History   Tobacco Use   Smoking status: Former    Current  packs/day: 0.00    Types: Cigarettes    Quit date: 1983    Years since quitting: 42.8    Passive exposure: Never   Smokeless tobacco: Never  Vaping Use   Vaping status: Never Used  Substance Use Topics   Alcohol use: Yes    Comment: socially   Drug use: Not Currently    Types: Marijuana    Comment: Rare     Allergies   Patient has no known allergies.   Review of Systems Review of Systems  Per HPI  Physical Exam Triage Vital Signs ED Triage Vitals  Encounter Vitals Group     BP 03/02/24 1318 (!) 146/81     Girls Systolic BP Percentile --      Girls Diastolic BP Percentile --      Boys Systolic BP Percentile --      Boys Diastolic BP Percentile --      Pulse Rate 03/02/24 1318 82     Resp 03/02/24 1318 20     Temp 03/02/24 1318 98.1 F (36.7 C)     Temp src --      SpO2 03/02/24 1318 97 %     Weight --      Height --      Head Circumference --      Peak Flow --      Pain Score 03/02/24 1315 7     Pain Loc --      Pain Education --      Exclude from Growth Chart --    No data found.  Updated Vital Signs BP (!) 146/81   Pulse 82   Temp 98.1 F (36.7 C)   Resp 20   SpO2 97%   Visual Acuity Right Eye Distance:   Left Eye Distance:   Bilateral Distance:    Right Eye Near:   Left Eye Near:    Bilateral Near:     Physical Exam Vitals and nursing note reviewed.  Constitutional:      General: He is awake. He is not in acute distress.    Appearance: Normal appearance. He is well-developed and well-groomed. He is not ill-appearing.  HENT:     Right Ear: Drainage present. Tympanic membrane is erythematous and bulging.     Left Ear: Drainage present. Tympanic membrane is erythematous and bulging.     Ears:     Comments: Bilateral TMs erythematous and bulging.  There is also purulent drainage and erythema noted to bilateral ear canals    Nose: Rhinorrhea present. No congestion.     Mouth/Throat:     Mouth: Mucous membranes are moist.     Pharynx:  Posterior oropharyngeal erythema present. No oropharyngeal exudate.  Cardiovascular:     Rate and Rhythm: Normal rate and regular rhythm.  Pulmonary:     Effort: Pulmonary effort is  normal.     Breath sounds: Normal breath sounds.  Skin:    General: Skin is warm and dry.  Neurological:     Mental Status: He is alert.  Psychiatric:        Behavior: Behavior is cooperative.      UC Treatments / Results  Labs (all labs ordered are listed, but only abnormal results are displayed) Labs Reviewed - No data to display  EKG   Radiology No results found.  Procedures Procedures (including critical care time)  Medications Ordered in UC Medications - No data to display  Initial Impression / Assessment and Plan / UC Course  I have reviewed the triage vital signs and the nursing notes.  Pertinent labs & imaging results that were available during my care of the patient were reviewed by me and considered in my medical decision making (see chart for details).     Patient is overall well-appearing.  Vitals are stable.  Exam findings consistent with otitis media and externa.  Prescribed Augmentin  and Ciprodex eardrops for infection coverage.  Given ENT follow-up per patient request for new ENT.  Discussed follow-up and return precautions. Final Clinical Impressions(s) / UC Diagnoses   Final diagnoses:  Non-recurrent acute suppurative otitis media of both ears without spontaneous rupture of tympanic membranes  Infective otitis externa of both ears     Discharge Instructions      Start taking Augmentin  twice daily for 7 days for ear infection. Also apply 4 drops of Ciprodex eardrops 2 times daily to both ears for additional infection coverage. You can follow-up with Dr. Karis who is an ear nose and throat doctor for further evaluation and management if needed. Otherwise follow-up with your primary care provider or return here as needed.   ED Prescriptions     Medication Sig  Dispense Auth. Provider   amoxicillin -clavulanate (AUGMENTIN ) 875-125 MG tablet Take 1 tablet by mouth every 12 (twelve) hours. 14 tablet Johnie, Kateryna Grantham A, NP   ciprofloxacin-dexamethasone (CIPRODEX) OTIC suspension Place 4 drops into both ears 2 (two) times daily. 7.5 mL Johnie Flaming A, NP      PDMP not reviewed this encounter.   Johnie Flaming A, NP 03/02/24 1356

## 2024-04-24 ENCOUNTER — Telehealth: Payer: Self-pay

## 2024-04-24 ENCOUNTER — Encounter: Payer: Medicaid Other | Admitting: Family Medicine

## 2024-04-24 NOTE — Telephone Encounter (Signed)
 Copied from CRM [id number]. Topic: Appointments - Scheduling Inquiry for Clinic >> Apr 24, 2024 12:27 PM Alfonso ORN wrote: Reason for CRM: Former pt of Comer Gaskins wanting to do transfer of care back to [facility] since he has moved back in the area. Please call pt to schedule if can be added he mentioned wanting a physical after establishing care.

## 2024-04-25 ENCOUNTER — Telehealth: Payer: Self-pay | Admitting: Family Medicine

## 2024-04-25 NOTE — Telephone Encounter (Signed)
 Pt would like a Transfer of Care to establish with Mallie Gaskins. Is that okay ?

## 2024-04-25 NOTE — Telephone Encounter (Signed)
 Sounds good! Okay to do CPE and re-establish appointment together. Can we schedule him at the end of the morning or afternoon session to allot enough time?

## 2024-05-10 NOTE — Telephone Encounter (Signed)
 Noted.  Can we please schedule him at the end of a morning or afternoon session as requested in the prior message?

## 2024-05-10 NOTE — Telephone Encounter (Signed)
 Called and changed date and time

## 2024-05-10 NOTE — Telephone Encounter (Signed)
 Called pt and schedule  CPE and re-establish appointment together

## 2024-05-29 ENCOUNTER — Encounter: Admitting: Primary Care

## 2024-05-30 ENCOUNTER — Encounter: Payer: Self-pay | Admitting: Primary Care

## 2024-05-30 ENCOUNTER — Ambulatory Visit (INDEPENDENT_AMBULATORY_CARE_PROVIDER_SITE_OTHER): Admitting: Primary Care

## 2024-05-30 VITALS — BP 134/84 | HR 87 | Temp 98.0°F | Ht 70.0 in | Wt 224.5 lb

## 2024-05-30 DIAGNOSIS — Z1211 Encounter for screening for malignant neoplasm of colon: Secondary | ICD-10-CM | POA: Diagnosis not present

## 2024-05-30 DIAGNOSIS — Z23 Encounter for immunization: Secondary | ICD-10-CM

## 2024-05-30 DIAGNOSIS — Z0000 Encounter for general adult medical examination without abnormal findings: Secondary | ICD-10-CM

## 2024-05-30 DIAGNOSIS — F32A Depression, unspecified: Secondary | ICD-10-CM | POA: Diagnosis not present

## 2024-05-30 DIAGNOSIS — R7989 Other specified abnormal findings of blood chemistry: Secondary | ICD-10-CM

## 2024-05-30 DIAGNOSIS — Z125 Encounter for screening for malignant neoplasm of prostate: Secondary | ICD-10-CM

## 2024-05-30 DIAGNOSIS — F419 Anxiety disorder, unspecified: Secondary | ICD-10-CM

## 2024-05-30 DIAGNOSIS — R6882 Decreased libido: Secondary | ICD-10-CM

## 2024-05-30 DIAGNOSIS — I456 Pre-excitation syndrome: Secondary | ICD-10-CM

## 2024-05-30 DIAGNOSIS — R7303 Prediabetes: Secondary | ICD-10-CM | POA: Diagnosis not present

## 2024-05-30 DIAGNOSIS — E785 Hyperlipidemia, unspecified: Secondary | ICD-10-CM

## 2024-05-30 MED ORDER — SILDENAFIL CITRATE 20 MG PO TABS: ORAL_TABLET | ORAL | 3 refills | Status: AC

## 2024-05-30 NOTE — Addendum Note (Signed)
 Addended by: JACKOLYN PLANAS on: 05/30/2024 04:17 PM   Modules accepted: Orders

## 2024-05-30 NOTE — Assessment & Plan Note (Signed)
 Repeat lipid panel pending.  Work on a healthy diet and regular exercise in order for weight loss, and to reduce the risk of further co-morbidity.

## 2024-05-30 NOTE — Assessment & Plan Note (Signed)
 No recent occurrences.  Following with electrophysiology, office notes reviewed from March 2025.

## 2024-05-30 NOTE — Assessment & Plan Note (Signed)
 Following with orthopedics.  Continue Lyrica 75 mg HS and cyclobenzaprine  5 mg HS. Continue Hydrocodone  5-325 mg once tablet BID PRN.

## 2024-05-30 NOTE — Assessment & Plan Note (Signed)
 Second Shingrix  vaccine provided today. Colonoscopy overdue, he agrees, orders placed.  PSA due and pending.  Discussed the importance of a healthy diet and regular exercise in order for weight loss, and to reduce the risk of further co-morbidity.  Exam stable. Labs pending.  Follow up in 1 year for repeat physical.

## 2024-05-30 NOTE — Progress Notes (Signed)
 "  Subjective:    Patient ID: Michael Farley, male    DOB: 02/21/1961, 64 y.o.   MRN: [id number]  Michael Farley is a very pleasant 64 y.o. male with a history of SVT, Wolff-Parkinson-White syndrome, chronic back pain, anxiety depression, hyperlipidemia who presents today to re-establish care and for complete physical and follow up of chronic conditions.  He has been seeing Petrina, NP in [place] of the last year.   Immunizations: -Tetanus: Completed in 2023 -Influenza: Declines  -Shingles: Completed 1 dose   Diet: Fair diet.  Exercise: No regular exercise.  Eye exam: Completed > 1 year ago  Dental exam: Completed > 1 year   PSA: Due  Colonoscopy: Completed > 10 years ago.  Has been referred to GI over the last 2 years. He has no driver.   BP Readings from Last 3 Encounters:  05/30/24 134/84  03/02/24 (!) 146/81  08/18/23 112/64        Review of Systems  Constitutional:  Negative for unexpected weight change.  HENT:  Negative for rhinorrhea.   Respiratory:  Negative for cough and shortness of breath.   Cardiovascular:  Negative for chest pain.  Gastrointestinal:  Negative for constipation and diarrhea.  Genitourinary:  Negative for difficulty urinating.  Musculoskeletal:  Positive for arthralgias and back pain.  Skin:  Negative for rash.  Allergic/Immunologic: Negative for environmental allergies.  Neurological:  Negative for dizziness and headaches.  Psychiatric/Behavioral:  The patient is not nervous/anxious.          Past Medical History:  Diagnosis Date   Acute non-recurrent frontal sinusitis 08/19/2021   Acute recurrent maxillary sinusitis 12/10/2021   Back pain    Hyperlipidemia    Kidney stones    Wolff-Parkinson-White (WPW) syndrome     Social History   Socioeconomic History   Marital status: Divorced    Spouse name: Not on file   Number of children: Not on file   Years of education: Not on file   Highest education level: Not on file   Occupational History   Not on file  Tobacco Use   Smoking status: Former    Current packs/day: 0.00    Types: Cigarettes    Quit date: 41    Years since quitting: 43.0    Passive exposure: Never   Smokeless tobacco: Never  Vaping Use   Vaping status: Never Used  Substance and Sexual Activity   Alcohol use: Yes    Comment: socially   Drug use: Not Currently    Types: Marijuana    Comment: Rare   Sexual activity: Not on file  Other Topics Concern   Not on file  Social History Narrative   Single.   1 child.   Works as a psychologist, sport and exercise.    Enjoys spending time with his girlfriend, spending time at the beach.    Social Drivers of Health   Tobacco Use: Medium Risk (05/30/2024)   Patient History    Smoking Tobacco Use: Former    Smokeless Tobacco Use: Never    Passive Exposure: Never  Physicist, Medical Strain: High Risk (06/14/2022)   Overall Financial Resource Strain (CARDIA)    Difficulty of Paying Living Expenses: Hard  Food Insecurity: Low Risk (07/04/2023)   Received from [facility]   Epic    Within the past 12 months, you worried that your food would run out before you got money to buy more: Never true    Within the past 12 months, the food you bought  just didn't last and you didn't have money to get more. : Never true  Transportation Needs: No Transportation Needs (07/04/2023)   [facility]   Transportation    In the past 12 months, has lack of reliable transportation kept you from medical appointments, meetings, work or from getting things needed for daily living? : No  Physical Activity: Not on file  Stress: Not on file  Social Connections: Not on file  Intimate Partner Violence: Not on file  Depression (PHQ2-9): Low Risk (06/28/2023)   Depression (PHQ2-9)    PHQ-2 Score: 0  Alcohol Screen: Not on file  Housing: Low Risk (07/04/2023)   Received from [facility]   Epic    What is your living situation today?: I have a steady place to live     Think about the place you live. Do you have problems with any of the following? Choose all that apply:: None/None on this list  Utilities: Low Risk (07/04/2023)   Received from [facility]   Utilities    In the past 12 months has the electric, gas, oil, or water company threatened to shut off services in your home? : No  Health Literacy: Not on file    Past Surgical History:  Procedure Laterality Date   KNEE SURGERY Left 1985   SVT ABLATION N/A 08/17/2022   Procedure: SVT ABLATION;  Surgeon: Waddell Danelle ORN, MD;  Location: [facility] INVASIVE CV LAB;  Service: Cardiovascular;  Laterality: N/A;    Family History  Problem Relation Age of Onset   COPD Mother    Hypertension Mother    Alcohol abuse Father    Diabetes Father    Prostate cancer Maternal Uncle    Stomach cancer Maternal Grandmother    Cancer Maternal Grandmother    Bladder Cancer Neg Hx    Kidney cancer Neg Hx    Colon cancer Neg Hx    Rectal cancer Neg Hx    Esophageal cancer Neg Hx     Allergies[1]  Medications Ordered Prior to Encounter[2]  BP 134/84   Pulse 87   Temp 98 F (36.7 C) (Oral)   Ht 5' 10 (1.778 m)   Wt 224 lb 8 oz (101.8 kg)   SpO2 94%   BMI 32.21 kg/m  Objective:   Physical Exam HENT:     Right Ear: Tympanic membrane and ear canal normal.     Left Ear: Tympanic membrane and ear canal normal.  Eyes:     Pupils: Pupils are equal, round, and reactive to light.  Cardiovascular:     Rate and Rhythm: Normal rate and regular rhythm.  Pulmonary:     Effort: Pulmonary effort is normal.     Breath sounds: Normal breath sounds.  Abdominal:     General: Bowel sounds are normal.     Palpations: Abdomen is soft.     Tenderness: There is no abdominal tenderness.  Musculoskeletal:        General: Normal range of motion.     Cervical back: Neck supple.  Skin:    General: Skin is warm and dry.  Neurological:     Mental Status: He is alert and oriented to person, place, and time.     Cranial  Nerves: No cranial nerve deficit.     Deep Tendon Reflexes:     Reflex Scores:      Patellar reflexes are 2+ on the right side and 2+ on the left side. Psychiatric:  Mood and Affect: Mood normal.     Physical Exam        Assessment & Plan:  Preventative health care Assessment & Plan: Second Shingrix  vaccine provided today. Colonoscopy overdue, he agrees, orders placed.  PSA due and pending.  Discussed the importance of a healthy diet and regular exercise in order for weight loss, and to reduce the risk of further co-morbidity.  Exam stable. Labs pending.  Follow up in 1 year for repeat physical.    Wolff-Parkinson-White (WPW) syndrome Assessment & Plan: No recent occurrences.  Following with electrophysiology, office notes reviewed from March 2025.    Prediabetes Assessment & Plan: Repeat A1C pending.  Work on a healthy diet and regular exercise in order for weight loss, and to reduce the risk of further co-morbidity.   Orders: -     Hemoglobin A1c  Hyperlipidemia, unspecified hyperlipidemia type Assessment & Plan: Repeat lipid panel pending.  Work on a healthy diet and regular exercise in order for weight loss, and to reduce the risk of further co-morbidity.   Orders: -     Lipid panel -     Comprehensive metabolic panel with GFR  Low testosterone Assessment & Plan: Following with [facility] in [place], [place].  Continue sildenafil  40 to 100 mg as needed. Refill provided.   Screening for colon cancer -     Ambulatory referral to Gastroenterology  Screening for prostate cancer -     PSA  Anxiety and depression Assessment & Plan: No concerns today.     Decreased sexual ability -     Sildenafil  Citrate; TAKE 2-5 TABLETS BY MOUTH AS NEEDED 30  MINUTES  PRIOR  TO intercourse  Dispense: 90 tablet; Refill: 3    Assessment and Plan Assessment & Plan         Comer MARLA Gaskins, NP       [1] No Known Allergies [2]  Current  Outpatient Medications on File Prior to Visit  Medication Sig Dispense Refill   cyclobenzaprine  (FLEXERIL ) 5 MG tablet Take 5 mg by mouth 2 (two) times daily.     fluticasone (FLONASE) 50 MCG/ACT nasal spray Place 1 spray into both nostrils daily.     HYDROcodone -acetaminophen  (NORCO/VICODIN) 5-325 MG tablet Take 1 tablet by mouth 2 (two) times daily as needed.     pregabalin (LYRICA) 75 MG capsule Take 75 mg by mouth 3 (three) times daily.     No current facility-administered medications on file prior to visit.   "

## 2024-05-30 NOTE — Patient Instructions (Signed)
Stop by the lab prior to leaving today. I will notify you of your results once received.   You will either be contacted via phone regarding your referral to GI, or you may receive a letter on your MyChart portal from our referral team with instructions for scheduling an appointment. Please let us know if you have not been contacted by anyone within two weeks.  It was a pleasure to see you today!  

## 2024-05-30 NOTE — Assessment & Plan Note (Signed)
 No concerns today.

## 2024-05-30 NOTE — Assessment & Plan Note (Signed)
 Repeat A1C pending.  Work on a healthy diet and regular exercise in order for weight loss, and to reduce the risk of further co-morbidity.

## 2024-05-30 NOTE — Assessment & Plan Note (Addendum)
 Following with [facility] in [place], [place].  Continue sildenafil  40 to 100 mg as needed. Refill provided.

## 2024-05-31 ENCOUNTER — Ambulatory Visit: Payer: Self-pay | Admitting: Primary Care

## 2024-05-31 ENCOUNTER — Ambulatory Visit

## 2024-05-31 DIAGNOSIS — E785 Hyperlipidemia, unspecified: Secondary | ICD-10-CM

## 2024-05-31 LAB — PSA: PSA: 3.29 ng/mL (ref 0.10–4.00)

## 2024-05-31 LAB — LIPID PANEL
Cholesterol: 291 mg/dL — ABNORMAL HIGH (ref 28–200)
HDL: 44.2 mg/dL
NonHDL: 246.86
Total CHOL/HDL Ratio: 7
Triglycerides: 413 mg/dL — ABNORMAL HIGH (ref 10.0–149.0)
VLDL: 82.6 mg/dL — ABNORMAL HIGH (ref 0.0–40.0)

## 2024-05-31 LAB — COMPREHENSIVE METABOLIC PANEL WITH GFR
ALT: 26 U/L (ref 3–53)
AST: 20 U/L (ref 5–37)
Albumin: 4.8 g/dL (ref 3.5–5.2)
Alkaline Phosphatase: 73 U/L (ref 39–117)
BUN: 13 mg/dL (ref 6–23)
CO2: 28 meq/L (ref 19–32)
Calcium: 9.8 mg/dL (ref 8.4–10.5)
Chloride: 102 meq/L (ref 96–112)
Creatinine, Ser: 0.9 mg/dL (ref 0.40–1.50)
GFR: 90.37 mL/min
Glucose, Bld: 103 mg/dL — ABNORMAL HIGH (ref 70–99)
Potassium: 5.1 meq/L (ref 3.5–5.1)
Sodium: 137 meq/L (ref 135–145)
Total Bilirubin: 0.7 mg/dL (ref 0.2–1.2)
Total Protein: 7.4 g/dL (ref 6.0–8.3)

## 2024-05-31 LAB — HEMOGLOBIN A1C: Hgb A1c MFr Bld: 5.3 % (ref 4.6–6.5)

## 2024-05-31 LAB — LDL CHOLESTEROL, DIRECT: Direct LDL: 216 mg/dL

## 2024-06-04 ENCOUNTER — Other Ambulatory Visit: Payer: Self-pay | Admitting: Primary Care

## 2024-06-04 DIAGNOSIS — E785 Hyperlipidemia, unspecified: Secondary | ICD-10-CM

## 2024-06-05 ENCOUNTER — Other Ambulatory Visit (INDEPENDENT_AMBULATORY_CARE_PROVIDER_SITE_OTHER)

## 2024-06-05 ENCOUNTER — Ambulatory Visit: Payer: Self-pay | Admitting: Primary Care

## 2024-06-05 DIAGNOSIS — R6882 Decreased libido: Secondary | ICD-10-CM

## 2024-06-05 DIAGNOSIS — R7989 Other specified abnormal findings of blood chemistry: Secondary | ICD-10-CM

## 2024-06-05 DIAGNOSIS — E785 Hyperlipidemia, unspecified: Secondary | ICD-10-CM

## 2024-06-05 LAB — LIPID PANEL
Cholesterol: 259 mg/dL — ABNORMAL HIGH (ref 28–200)
HDL: 37.4 mg/dL — ABNORMAL LOW
LDL Cholesterol: 144 mg/dL — ABNORMAL HIGH (ref 10–99)
NonHDL: 221.73
Total CHOL/HDL Ratio: 7
Triglycerides: 387 mg/dL — ABNORMAL HIGH (ref 10.0–149.0)
VLDL: 77.4 mg/dL — ABNORMAL HIGH (ref 0.0–40.0)

## 2024-06-07 LAB — LIPOPROTEIN A (LPA): Lipoprotein (a): <10 nmol/L

## 2024-06-08 ENCOUNTER — Ambulatory Visit: Payer: Self-pay | Admitting: Primary Care

## 2024-06-12 ENCOUNTER — Other Ambulatory Visit (INDEPENDENT_AMBULATORY_CARE_PROVIDER_SITE_OTHER)

## 2024-06-12 ENCOUNTER — Other Ambulatory Visit: Payer: Self-pay | Admitting: Primary Care

## 2024-06-12 DIAGNOSIS — R6882 Decreased libido: Secondary | ICD-10-CM

## 2024-06-12 DIAGNOSIS — R7989 Other specified abnormal findings of blood chemistry: Secondary | ICD-10-CM | POA: Diagnosis not present

## 2024-06-12 DIAGNOSIS — E785 Hyperlipidemia, unspecified: Secondary | ICD-10-CM

## 2024-06-12 NOTE — Telephone Encounter (Signed)
 Copied from CRM #[id number]. Topic: Clinical - Request for Lab/Test Order >> Jun 11, 2024  4:50 PM Rea ORN wrote: Reason for CRM: Pt stated he would like PCP to order CT scan to look at arteries in his heart.   Please call back to advise,  [phone]

## 2024-06-16 ENCOUNTER — Emergency Department (HOSPITAL_COMMUNITY)
Admission: EM | Admit: 2024-06-16 | Discharge: 2024-06-16 | Disposition: A | Discharge status: Home / Self Care | Attending: Emergency Medicine | Admitting: Emergency Medicine

## 2024-06-16 ENCOUNTER — Other Ambulatory Visit: Payer: Self-pay

## 2024-06-16 ENCOUNTER — Encounter (HOSPITAL_COMMUNITY): Payer: Self-pay

## 2024-06-16 DIAGNOSIS — T161XXA Foreign body in right ear, initial encounter: Secondary | ICD-10-CM | POA: Diagnosis present

## 2024-06-16 DIAGNOSIS — H60391 Other infective otitis externa, right ear: Secondary | ICD-10-CM | POA: Diagnosis not present

## 2024-06-16 DIAGNOSIS — X58XXXA Exposure to other specified factors, initial encounter: Secondary | ICD-10-CM | POA: Diagnosis not present

## 2024-06-16 LAB — ESTROGENS, TOTAL: Estrogen: 396 pg/mL

## 2024-06-16 MED ORDER — CIPROFLOXACIN-DEXAMETHASONE 0.3-0.1 % OT SUSP: 4.0000 [drp] | Freq: Two times a day (BID) | OTIC | 0 refills | Status: AC

## 2024-06-16 NOTE — ED Triage Notes (Signed)
 Patient states he has a portion of a cotton ball stuck in his ear. He was trying to get it out and he accidentally pushed it further

## 2024-06-16 NOTE — Discharge Instructions (Addendum)
 Your right ear appeared inflamed on exam today.  Keep the affected ear dry.  Do not insert cotton swabs, fingers, or any other objects into the ear canal.  Apply drops to right ear twice daily for 7 days. Please follow-up with your ENT for further evaluation.  Return for worsening pain, fevers, drainage of ear, dizziness.

## 2024-06-16 NOTE — ED Provider Notes (Signed)
 " [place] EMERGENCY DEPARTMENT AT [facility] Provider Note   CSN: [id number] Arrival date & time: 06/16/24  1410     Patient presents with: Foreign Body in Ear   Michael Farley is a 64 y.o. male.  presents for evaluation of foreign body in right ear.  He states that he was using steroid drops and cotton ball in his right ear when he got it stuck in the right ear canal.  Patient notes decreased hearing from the right ear.  Also notes some tenderness.  He has history of multiple ear infections and has an ENT that he follows.  Denies fevers, chills, cough, shortness of breath, chest pain, tinnitus, vertigo, dizziness, or any other symptoms at this time.      Foreign Body in Ear       Prior to Admission medications  Medication Sig Start Date End Date Taking? Authorizing Provider  ciprofloxacin -dexamethasone  (CIPRODEX ) OTIC suspension Place 4 drops into the right ear 2 (two) times daily for 7 days. 06/16/24 06/23/24 Yes Elgie Landino, PA-C  cyclobenzaprine  (FLEXERIL ) 5 MG tablet Take 5 mg by mouth 2 (two) times daily. 04/02/23   [provider]  fluticasone (FLONASE) 50 MCG/ACT nasal spray Place 1 spray into both nostrils daily. 07/04/23   [provider]  HYDROcodone -acetaminophen  (NORCO/VICODIN) 5-325 MG tablet Take 1 tablet by mouth 2 (two) times daily as needed.    [provider]  pregabalin (LYRICA) 75 MG capsule Take 75 mg by mouth 3 (three) times daily.    [provider]  sildenafil  (REVATIO ) 20 MG tablet TAKE 2-5 TABLETS BY MOUTH AS NEEDED 30  MINUTES  PRIOR  TO intercourse 05/30/24   Clark, Katherine K, NP    Allergies: Patient has no known allergies.    Review of Systems  Updated Vital Signs BP 126/80 (BP Location: Right Arm)   Pulse 93   Temp 98.5 F (36.9 C) (Oral)   Resp 16   Ht 5' 10 (1.778 m)   Wt 101.8 kg   SpO2 97%   BMI 32.20 kg/m   Physical Exam Vitals and nursing note reviewed.  Constitutional:       General: He is not in acute distress.    Appearance: He is well-developed.  HENT:     Head: Normocephalic and atraumatic.     Right Ear: Decreased hearing noted. Tenderness present. No laceration, drainage or swelling. No middle ear effusion. There is no impacted cerumen. A foreign body is present. No mastoid tenderness. No PE tube. No hemotympanum. Tympanic membrane is erythematous. Tympanic membrane is not injected, scarred, perforated, retracted or bulging. Tympanic membrane has normal mobility.     Left Ear: Hearing, tympanic membrane, ear canal and external ear normal.     Ears:     Comments: Very small pieces of cotton around the right TM noted, possible fungal infection right ear.  Erythematous right TM but not bulging or injected.  Decreased hearing of the right ear.    Nose: Nose normal.     Mouth/Throat:     Lips: Pink.     Mouth: Mucous membranes are moist.  Eyes:     Conjunctiva/sclera: Conjunctivae normal.  Cardiovascular:     Rate and Rhythm: Normal rate and regular rhythm.     Heart sounds: No murmur heard. Pulmonary:     Effort: Pulmonary effort is normal. No respiratory distress.     Breath sounds: Normal breath sounds.  Abdominal:     Palpations: Abdomen is soft.  Tenderness: There is no abdominal tenderness.  Musculoskeletal:        General: No swelling.     Cervical back: Neck supple.  Skin:    General: Skin is warm and dry.     Capillary Refill: Capillary refill takes less than 2 seconds.  Neurological:     Mental Status: He is alert.  Psychiatric:        Mood and Affect: Mood normal.     (all labs ordered are listed, but only abnormal results are displayed) Labs Reviewed - No data to display  EKG: None  Radiology: No results found.   Procedures   Medications Ordered in the ED - No data to display                                  Medical Decision Making  Patient is a 64 year old male presenting today with foreign body in right ear.  He  was inserting cotton ball and a steroid eardrop for fungal infection of his ear when he got the cottonball stuck.  He notes decreased hearing from the right ear as well as some tenderness.  On exam he is alert and oriented with no apparent distress.  On otoscope, there are very small pieces of cotton around the right TM along with possible fungal infection.  Right TM is erythematous but nonbulging or injected.  Sensation intact.  No signs of mastoid tenderness.  No drainage from ear.  I attempted to remove cotton ball with alligator forceps but did not succeed as the pieces were very small.  I recommended patient to apply Ciprodex  drops to right ear canal.  Also recommended patient to follow-up with ENT for further evaluation.  Patient already has an established ENT.  Vital signs stable.  Patient is in agreement with plan and is stable for discharge.         Final diagnoses:  Foreign body of right ear, initial encounter  Other infective acute otitis externa of right ear    ED Discharge Orders          Ordered    ciprofloxacin -dexamethasone  (CIPRODEX ) OTIC suspension  2 times daily        06/16/24 1446               Braxton Dubois, PA-C 06/16/24 1457    Long, Joshua G, MD 06/21/24 0818  "

## 2024-06-17 ENCOUNTER — Ambulatory Visit: Payer: Self-pay | Admitting: Primary Care

## 2024-06-21 ENCOUNTER — Telehealth: Payer: Self-pay

## 2024-06-21 NOTE — Transitions of Care (Post Inpatient/ED Visit) (Unsigned)
 Pt thought he had something in his rt ear and ED saw small amt of cotton in rt ear but also thought pt had ear infection.pt had rt ear tenderness and decreased hearing in rt ear. pt said feels slightlybetter since using abx ear gtts bid. Pt said even though the rt ear is slightly better pt is concerned about long term hearing loss issues and is requesting an ENT referral for a second opinion in the [place] area.pt said he will see ENT at [facility] for next appt while waiting for 2nd opinion ENT referral. Pt said per lab result note on 06/05/24 pt has decided he does want to go ahead and get CT scan that looks at the arteries of the heart in the [place] area. Sending note to MARLA Gaskins NP.        06/21/2024  Name: Michael Farley MRN: [id number] DOB: [birth year]  Today's TOC FU Call Status: Today's TOC FU Call Status:: Successful TOC FU Call Completed TOC FU Call Complete Date: 06/21/24  Patient's Name and Date of Birth confirmed. Name, DOB  Transition Care Management Follow-up Telephone Call Date of Discharge: 06/16/24 Discharge Facility: [facility] Type of Discharge: Emergency Department Reason for ED Visit: Other: (Pt thought he had something in his rt ear and ED saw small amt of cotton in rt ear but also thought pt had ear inferction.pt had rt ear tenderness and decreased hearing in rt ear. pt said feels slightlybetter since using abx ear gtts bid.) How have you been since you were released from the hospital?: Better Any questions or concerns?: Yes Patient Questions/Concerns:: pt wants 2nd opinion due to fear of continued hearing loss Patient Questions/Concerns Addressed: Notified Provider of Patient Questions/Concerns  Items Reviewed: Did you receive and understand the discharge instructions provided?: Yes Medications obtained,verified, and reconciled?: Partial Review Completed Reason for Partial Mediation Review: pt understood reason for using ciprofloxacin -dexamethasone  gtts  bid and pt said these gtts are helping Any new allergies since your discharge?: No Dietary orders reviewed?: NA Do you have support at home?: No (pt lives alone and does not have help from anyone.)  Medications Reviewed Today: Medications Reviewed Today   Medications were not reviewed in this encounter     Home Care and Equipment/Supplies: Were Home Health Services Ordered?: NA Any new equipment or medical supplies ordered?: NA  Functional Questionnaire: Do you need assistance with bathing/showering or dressing?: No Do you need assistance with meal preparation?: No Do you need assistance with eating?: No Do you have difficulty maintaining continence: No Do you need assistance with getting out of bed/getting out of a chair/moving?: No Do you have difficulty managing or taking your medications?: No  Follow up appointments reviewed: PCP Follow-up appointment confirmed?: No (pt said he wants to follow wioth ENT at this time.) MD Provider Line [phone] Given: Yes Specialist Hospital Follow-up appointment confirmed?: Yes Date of Specialist follow-up appointment?:  (pt said he knows his next appt with ENT is next week and would have to look up the exact day and time.) Follow-Up Specialty Provider:: pt does not remember doctors name but said ENT is with [facility]. Do you need transportation to your follow-up appointment?: No Do you understand care options if your condition(s) worsen?: Yes-patient verbalized understanding    SIGNATURE. Laray Arenas, LPN

## 2024-06-23 NOTE — Telephone Encounter (Signed)
 Noted. Please let him know that I placed an order for the CT scan 2 weeks ago. He can call [facility] in [place] to schedule [phone]

## 2024-06-25 NOTE — Telephone Encounter (Signed)
 Left message to return call to our office.  Please provide  message from provider/office when call is returned from patient.

## 2024-06-26 NOTE — Telephone Encounter (Signed)
 Left message on voicemail, per dpr, relaying Kate's message.

## 2024-09-17 ENCOUNTER — Other Ambulatory Visit
